# Patient Record
Sex: Male | Born: 1950 | Race: White | Hispanic: No | Smoking: Never smoker
Health system: Southern US, Community
[De-identification: ages and names within clinical notes are randomized; demographics above are authoritative.]

## PROBLEM LIST (undated history)

## (undated) DIAGNOSIS — K429 Umbilical hernia without obstruction or gangrene: Secondary | ICD-10-CM

## (undated) DIAGNOSIS — M1990 Unspecified osteoarthritis, unspecified site: Secondary | ICD-10-CM

## (undated) DIAGNOSIS — J449 Chronic obstructive pulmonary disease, unspecified: Secondary | ICD-10-CM

## (undated) DIAGNOSIS — K219 Gastro-esophageal reflux disease without esophagitis: Secondary | ICD-10-CM

## (undated) DIAGNOSIS — N209 Urinary calculus, unspecified: Secondary | ICD-10-CM

## (undated) HISTORY — DX: Chronic obstructive pulmonary disease, unspecified: J44.9

## (undated) HISTORY — PX: CHOLECYSTECTOMY: SHX55

---

## 1994-04-23 HISTORY — PX: HERNIA REPAIR: SHX51

## 2011-11-13 ENCOUNTER — Ambulatory Visit (HOSPITAL_COMMUNITY)
Admission: RE | Admit: 2011-11-13 | Discharge: 2011-11-13 | Disposition: A | Discharge status: Home / Self Care | Payer: Self-pay | Source: Ambulatory Visit | Attending: Neurosurgery | Admitting: Neurosurgery

## 2011-11-13 DIAGNOSIS — M7989 Other specified soft tissue disorders: Secondary | ICD-10-CM | POA: Insufficient documentation

## 2011-11-13 DIAGNOSIS — R609 Edema, unspecified: Secondary | ICD-10-CM

## 2011-11-13 NOTE — Progress Notes (Signed)
Bilateral lower extremity venous duplex completed.  Preliminary report is negative for DVT, SVT, or a Baker's cyst. 

## 2011-11-15 ENCOUNTER — Other Ambulatory Visit: Payer: Self-pay | Admitting: Neurosurgery

## 2011-12-10 ENCOUNTER — Encounter (HOSPITAL_COMMUNITY)
Admission: RE | Admit: 2011-12-10 | Discharge: 2011-12-10 | Payer: Self-pay | Source: Ambulatory Visit | Attending: Neurosurgery | Admitting: Neurosurgery

## 2012-01-11 ENCOUNTER — Other Ambulatory Visit: Payer: Self-pay | Admitting: Neurosurgery

## 2012-01-11 ENCOUNTER — Encounter (HOSPITAL_COMMUNITY)
Admission: RE | Admit: 2012-01-11 | Discharge: 2012-01-11 | Payer: Self-pay | Source: Ambulatory Visit | Attending: Neurosurgery | Admitting: Neurosurgery

## 2012-01-11 NOTE — Pre-Procedure Instructions (Signed)
20 LACHLAN MCKIM  01/11/2012   Your procedure is scheduled on:  Thursday September 26  Report to [facility] Short Stay Center at 5:30 AM.  Call this number if you have problems the morning of surgery: [phone]   Remember:   Do not eat or drink:After Midnight.    Take these medicines the morning of surgery with A SIP OF WATER:    Do not wear jewelry, make-up or nail polish.  Do not wear lotions, powders, or perfumes. You may wear deodorant.  Do not shave 48 hours prior to surgery. Men may shave face and neck.  Do not bring valuables to the hospital.  Contacts, dentures or bridgework may not be worn into surgery.  Leave suitcase in the car. After surgery it may be brought to your room.  For patients admitted to the hospital, checkout time is 11:00 AM the day of discharge.   Patients discharged the day of surgery will not be allowed to drive home.  Name and phone number of your driver: NA  Special Instructions: Shower using CHG 2 nights before surgery and the night before surgery.  If you shower the day of surgery use CHG.  Use special wash - you have one bottle of CHG for all showers.  You should use approximately 1/3 of the bottle for each shower.   Please read over the following fact sheets that you were given: Pain Booklet, Coughing and Deep Breathing, Blood Transfusion Information and Surgical Site Infection Prevention

## 2012-01-17 ENCOUNTER — Encounter (HOSPITAL_COMMUNITY): Admission: RE | Payer: Self-pay | Source: Ambulatory Visit

## 2012-01-17 ENCOUNTER — Ambulatory Visit (HOSPITAL_COMMUNITY): Admission: RE | Admit: 2012-01-17 | Payer: Self-pay | Source: Ambulatory Visit | Admitting: Neurosurgery

## 2012-01-17 SURGERY — POSTERIOR LUMBAR FUSION 1 LEVEL
Anesthesia: General | Site: Back | Laterality: Bilateral

## 2014-08-05 ENCOUNTER — Other Ambulatory Visit: Payer: Self-pay | Admitting: Neurosurgery

## 2014-09-14 ENCOUNTER — Encounter (HOSPITAL_COMMUNITY)
Admission: RE | Admit: 2014-09-14 | Discharge: 2014-09-14 | Disposition: A | Discharge status: Home / Self Care | Payer: Medicare Other | Source: Ambulatory Visit | Attending: Neurosurgery | Admitting: Neurosurgery

## 2014-09-14 ENCOUNTER — Encounter (HOSPITAL_COMMUNITY): Payer: Self-pay

## 2014-09-14 DIAGNOSIS — Z01812 Encounter for preprocedural laboratory examination: Secondary | ICD-10-CM | POA: Insufficient documentation

## 2014-09-14 DIAGNOSIS — M4316 Spondylolisthesis, lumbar region: Secondary | ICD-10-CM | POA: Insufficient documentation

## 2014-09-14 HISTORY — DX: Urinary calculus, unspecified: N20.9

## 2014-09-14 HISTORY — DX: Unspecified osteoarthritis, unspecified site: M19.90

## 2014-09-14 HISTORY — DX: Umbilical hernia without obstruction or gangrene: K42.9

## 2014-09-14 HISTORY — DX: Gastro-esophageal reflux disease without esophagitis: K21.9

## 2014-09-14 LAB — CBC
HCT: 44.9 % (ref 39.0–52.0)
Hemoglobin: 15.6 g/dL (ref 13.0–17.0)
MCH: 32.3 pg (ref 26.0–34.0)
MCHC: 34.7 g/dL (ref 30.0–36.0)
MCV: 93 fL (ref 78.0–100.0)
Platelets: 148 10*3/uL — ABNORMAL LOW (ref 150–400)
RBC: 4.83 MIL/uL (ref 4.22–5.81)
RDW: 13.6 % (ref 11.5–15.5)
WBC: 8.8 10*3/uL (ref 4.0–10.5)

## 2014-09-14 LAB — BASIC METABOLIC PANEL
Anion gap: 9 (ref 5–15)
BUN: 14 mg/dL (ref 6–20)
CALCIUM: 9.6 mg/dL (ref 8.9–10.3)
CHLORIDE: 99 mmol/L — AB (ref 101–111)
CO2: 28 mmol/L (ref 22–32)
Creatinine, Ser: 0.66 mg/dL (ref 0.61–1.24)
GFR calc Af Amer: >60 mL/min (ref >60)
GLUCOSE: 100 mg/dL — AB (ref 65–99)
Potassium: 4.3 mmol/L (ref 3.5–5.1)
Sodium: 136 mmol/L (ref 135–145)

## 2014-09-14 LAB — ABO/RH: ABO/RH(D): O POS

## 2014-09-14 LAB — TYPE AND SCREEN
ABO/RH(D): O POS
ANTIBODY SCREEN: NEGATIVE

## 2014-09-14 LAB — SURGICAL PCR SCREEN
MRSA, PCR: NEGATIVE
STAPHYLOCOCCUS AUREUS: NEGATIVE

## 2014-09-14 NOTE — Progress Notes (Signed)
Office called to sign orders

## 2014-09-14 NOTE — Pre-Procedure Instructions (Addendum)
William Dunlap  09/14/2014     No Pharmacies Listed   Your procedure is scheduled on 09/22/14  [facility]  [facility] short stay admitting at  745 A.M.  Call this number if you have problems the morning of surgery:  [phone]   Remember:  Do not eat food or drink liquids after midnight.  Take these medicines the morning of surgery with A SIP OF WATER methadone, prilosec    STOP all herbel meds, nsaids (aleve,naproxen,advil,ibuprofen) 5 days prior to surgery starting 5.27.16 including vitamins, aspirin,coq10   Do not wear jewelry, make-up or nail polish.  Do not wear lotions, powders, or perfumes.  You may wear deodorant.  Do not shave 48 hours prior to surgery.  Men may shave face and neck.  Do not bring valuables to the hospital.  [facility] is not responsible for any belongings or valuables.  Contacts, dentures or bridgework may not be worn into surgery.  Leave your suitcase in the car.  After surgery it may be brought to your room.  For patients admitted to the hospital, discharge time will be determined by your treatment team.  Patients discharged the day of surgery will not be allowed to drive home.   Name and phone number of your driver:    Special instructions:   Special Instructions: [facility] - Preparing for Surgery  Before surgery, you can play an important role.  Because skin is not sterile, your skin needs to be as free of germs as possible.  You can reduce the number of germs on you skin by washing with CHG (chlorahexidine gluconate) soap before surgery.  CHG is an antiseptic cleaner which kills germs and bonds with the skin to continue killing germs even after washing.  Please DO NOT use if you have an allergy to CHG or antibacterial soaps.  If your skin becomes reddened/irritated stop using the CHG and inform your nurse when you arrive at Short Stay.  Do not shave (including legs and underarms) for at least 48 hours prior to the first CHG shower.  You may shave  your face.  Please follow these instructions carefully:   1.  Shower with CHG Soap the night before surgery and the morning of Surgery.  2.  If you choose to wash your hair, wash your hair first as usual with your normal shampoo.  3.  After you shampoo, rinse your hair and body thoroughly to remove the Shampoo.  4.  Use CHG as you would any other liquid soap.  You can apply chg directly  to the skin and wash gently with scrungie or a clean washcloth.  5.  Apply the CHG Soap to your body ONLY FROM THE NECK DOWN.  Do not use on open wounds or open sores.  Avoid contact with your eyes ears, mouth and genitals (private parts).  Wash genitals (private parts)       with your normal soap.  6.  Wash thoroughly, paying special attention to the area where your surgery will be performed.  7.  Thoroughly rinse your body with warm water from the neck down.  8.  DO NOT shower/wash with your normal soap after using and rinsing off the CHG Soap.  9.  Pat yourself dry with a clean towel.            10.  Wear clean pajamas.            11.  Place clean sheets on your bed  the night of your first shower and do not sleep with pets.  Day of Surgery  Do not apply any lotions/deodorants the morning of surgery.  Please wear clean clothes to the hospital/surgery center.  Please read over the following fact sheets that you were given. Pain Booklet, Coughing and Deep Breathing, Blood Transfusion Information, MRSA Information and Surgical Site Infection Prevention

## 2014-09-21 MED ORDER — DEXAMETHASONE SODIUM PHOSPHATE 10 MG/ML IJ SOLN
10.0000 mg | INTRAMUSCULAR | Status: DC
  Filled 2014-09-21: qty 1

## 2014-09-21 MED ORDER — CEFAZOLIN SODIUM-DEXTROSE 2-3 GM-% IV SOLR
2.0000 g | INTRAVENOUS | Status: AC
  Administered 2014-09-22 (×2): 2 g via INTRAVENOUS
  Filled 2014-09-21: qty 50

## 2014-09-22 ENCOUNTER — Inpatient Hospital Stay (HOSPITAL_COMMUNITY): Payer: Medicare HMO | Admitting: Certified Registered Nurse Anesthetist

## 2014-09-22 ENCOUNTER — Encounter (HOSPITAL_COMMUNITY): Payer: Self-pay | Admitting: *Deleted

## 2014-09-22 ENCOUNTER — Encounter (HOSPITAL_COMMUNITY)
Admission: RE | Disposition: A | Discharge status: Home / Self Care | Payer: Self-pay | Source: Ambulatory Visit | Attending: Neurosurgery

## 2014-09-22 ENCOUNTER — Inpatient Hospital Stay (HOSPITAL_COMMUNITY): Payer: Medicare HMO

## 2014-09-22 ENCOUNTER — Inpatient Hospital Stay (HOSPITAL_COMMUNITY)
Admission: RE | Admit: 2014-09-22 | Discharge: 2014-09-25 | DRG: 460 | Disposition: A | Discharge status: Home / Self Care | Payer: Medicare HMO | Source: Ambulatory Visit | Attending: Neurosurgery | Admitting: Neurosurgery

## 2014-09-22 DIAGNOSIS — Z882 Allergy status to sulfonamides status: Secondary | ICD-10-CM | POA: Diagnosis not present

## 2014-09-22 DIAGNOSIS — Z791 Long term (current) use of non-steroidal anti-inflammatories (NSAID): Secondary | ICD-10-CM | POA: Diagnosis not present

## 2014-09-22 DIAGNOSIS — M1990 Unspecified osteoarthritis, unspecified site: Secondary | ICD-10-CM | POA: Diagnosis present

## 2014-09-22 DIAGNOSIS — M4806 Spinal stenosis, lumbar region: Secondary | ICD-10-CM | POA: Diagnosis present

## 2014-09-22 DIAGNOSIS — K219 Gastro-esophageal reflux disease without esophagitis: Secondary | ICD-10-CM | POA: Diagnosis present

## 2014-09-22 DIAGNOSIS — M4317 Spondylolisthesis, lumbosacral region: Secondary | ICD-10-CM | POA: Diagnosis present

## 2014-09-22 DIAGNOSIS — Z79899 Other long term (current) drug therapy: Secondary | ICD-10-CM | POA: Diagnosis not present

## 2014-09-22 DIAGNOSIS — M79604 Pain in right leg: Secondary | ICD-10-CM | POA: Diagnosis present

## 2014-09-22 DIAGNOSIS — Z419 Encounter for procedure for purposes other than remedying health state, unspecified: Secondary | ICD-10-CM

## 2014-09-22 LAB — GLUCOSE, CAPILLARY: GLUCOSE-CAPILLARY: 163 mg/dL — AB (ref 65–99)

## 2014-09-22 SURGERY — POSTERIOR LUMBAR FUSION 3 LEVEL
Anesthesia: General | Site: Back

## 2014-09-22 MED ORDER — PROPOFOL 10 MG/ML IV BOLUS
INTRAVENOUS | Status: DC | PRN
  Administered 2014-09-22: 20 mg via INTRAVENOUS
  Administered 2014-09-22: 120 mg via INTRAVENOUS
  Administered 2014-09-22: 30 mg via INTRAVENOUS

## 2014-09-22 MED ORDER — OXYCODONE-ACETAMINOPHEN 5-325 MG PO TABS
1.0000 | ORAL_TABLET | ORAL | Status: DC | PRN
  Administered 2014-09-22 – 2014-09-25 (×11): 2 via ORAL
  Filled 2014-09-22 (×10): qty 2

## 2014-09-22 MED ORDER — DEXAMETHASONE SODIUM PHOSPHATE 4 MG/ML IJ SOLN
INTRAMUSCULAR | Status: DC | PRN
  Administered 2014-09-22: 4 mg via INTRAVENOUS

## 2014-09-22 MED ORDER — SENNOSIDES-DOCUSATE SODIUM 8.6-50 MG PO TABS
1.0000 | ORAL_TABLET | Freq: Every evening | ORAL | Status: DC | PRN
  Administered 2014-09-23 – 2014-09-24 (×2): 1 via ORAL
  Filled 2014-09-22 (×2): qty 1

## 2014-09-22 MED ORDER — CYCLOBENZAPRINE HCL 10 MG PO TABS
ORAL_TABLET | ORAL | Status: AC
  Filled 2014-09-22: qty 1

## 2014-09-22 MED ORDER — BUPIVACAINE HCL (PF) 0.25 % IJ SOLN: INTRAMUSCULAR | Status: DC | PRN

## 2014-09-22 MED ORDER — GLYCOPYRROLATE 0.2 MG/ML IJ SOLN
INTRAMUSCULAR | Status: DC | PRN
  Administered 2014-09-22: .8 mg via INTRAVENOUS
  Administered 2014-09-22: .2 mg via INTRAVENOUS

## 2014-09-22 MED ORDER — ARTIFICIAL TEARS OP OINT
TOPICAL_OINTMENT | OPHTHALMIC | Status: AC
  Filled 2014-09-22: qty 3.5

## 2014-09-22 MED ORDER — OXYCODONE-ACETAMINOPHEN 5-325 MG PO TABS
ORAL_TABLET | ORAL | Status: AC
  Filled 2014-09-22: qty 2

## 2014-09-22 MED ORDER — FENTANYL CITRATE (PF) 250 MCG/5ML IJ SOLN
INTRAMUSCULAR | Status: AC
  Filled 2014-09-22: qty 5

## 2014-09-22 MED ORDER — GLYCOPYRROLATE 0.2 MG/ML IJ SOLN
INTRAMUSCULAR | Status: AC
  Filled 2014-09-22: qty 2

## 2014-09-22 MED ORDER — LACTATED RINGERS IV SOLN
INTRAVENOUS | Status: DC
Start: 2014-09-22 — End: 2014-09-25
  Administered 2014-09-22 (×4): via INTRAVENOUS

## 2014-09-22 MED ORDER — IBUPROFEN 200 MG PO TABS: 400.0000 mg | ORAL_TABLET | Freq: Three times a day (TID) | ORAL | Status: DC | PRN

## 2014-09-22 MED ORDER — MIDAZOLAM HCL 2 MG/2ML IJ SOLN
INTRAMUSCULAR | Status: AC
  Filled 2014-09-22: qty 2

## 2014-09-22 MED ORDER — NEOSTIGMINE METHYLSULFATE 10 MG/10ML IV SOLN
INTRAVENOUS | Status: DC | PRN
  Administered 2014-09-22: 5 mg via INTRAVENOUS

## 2014-09-22 MED ORDER — ROCURONIUM BROMIDE 50 MG/5ML IV SOLN
INTRAVENOUS | Status: AC
  Filled 2014-09-22: qty 1

## 2014-09-22 MED ORDER — POTASSIUM CHLORIDE IN NACL 20-0.9 MEQ/L-% IV SOLN
INTRAVENOUS | Status: DC
Start: 2014-09-22 — End: 2014-09-25
  Administered 2014-09-22: 21:00:00 via INTRAVENOUS
  Filled 2014-09-22: qty 1000

## 2014-09-22 MED ORDER — DEXTROSE 5 % IV SOLN
10.0000 mg | INTRAVENOUS | Status: DC | PRN
  Administered 2014-09-22: 10 ug/min via INTRAVENOUS

## 2014-09-22 MED ORDER — SODIUM CHLORIDE 0.9 % IV SOLN: 250.0000 mL | INTRAVENOUS | Status: DC

## 2014-09-22 MED ORDER — HYDROMORPHONE HCL 1 MG/ML IJ SOLN
INTRAMUSCULAR | Status: AC
  Filled 2014-09-22: qty 1

## 2014-09-22 MED ORDER — ACETAMINOPHEN 325 MG PO TABS
650.0000 mg | ORAL_TABLET | ORAL | Status: DC | PRN
  Filled 2014-09-22: qty 2

## 2014-09-22 MED ORDER — MEPERIDINE HCL 25 MG/ML IJ SOLN: 6.2500 mg | INTRAMUSCULAR | Status: DC | PRN

## 2014-09-22 MED ORDER — VANCOMYCIN HCL 1000 MG IV SOLR
INTRAVENOUS | Status: DC | PRN
  Administered 2014-09-22: 1000 mg via TOPICAL

## 2014-09-22 MED ORDER — BUPIVACAINE LIPOSOME 1.3 % IJ SUSP
INTRAMUSCULAR | Status: DC | PRN
  Administered 2014-09-22: 20 mL

## 2014-09-22 MED ORDER — SODIUM CHLORIDE 0.9 % IV SOLN
INTRAVENOUS | Status: DC | PRN
  Administered 2014-09-22: 15:00:00 via INTRAVENOUS

## 2014-09-22 MED ORDER — PHENOL 1.4 % MT LIQD: 1.0000 | OROMUCOSAL | Status: DC | PRN

## 2014-09-22 MED ORDER — PROPOFOL 10 MG/ML IV BOLUS
INTRAVENOUS | Status: AC
  Filled 2014-09-22: qty 20

## 2014-09-22 MED ORDER — EPHEDRINE SULFATE 50 MG/ML IJ SOLN
INTRAMUSCULAR | Status: DC | PRN
  Administered 2014-09-22: 10 mg via INTRAVENOUS
  Administered 2014-09-22 (×2): 5 mg via INTRAVENOUS

## 2014-09-22 MED ORDER — MENTHOL 3 MG MT LOZG: 1.0000 | LOZENGE | OROMUCOSAL | Status: DC | PRN

## 2014-09-22 MED ORDER — GLYCOPYRROLATE 0.2 MG/ML IJ SOLN
INTRAMUSCULAR | Status: AC
  Filled 2014-09-22: qty 1

## 2014-09-22 MED ORDER — HYDROMORPHONE HCL 1 MG/ML IJ SOLN
0.2500 mg | INTRAMUSCULAR | Status: DC | PRN
  Administered 2014-09-22 (×4): 0.5 mg via INTRAVENOUS

## 2014-09-22 MED ORDER — METHADONE HCL 10 MG PO TABS
40.0000 mg | ORAL_TABLET | Freq: Every day | ORAL | Status: DC
  Administered 2014-09-23 – 2014-09-25 (×3): 40 mg via ORAL
  Filled 2014-09-22 (×3): qty 4

## 2014-09-22 MED ORDER — CYCLOBENZAPRINE HCL 10 MG PO TABS
10.0000 mg | ORAL_TABLET | Freq: Three times a day (TID) | ORAL | Status: DC | PRN
  Administered 2014-09-22 – 2014-09-24 (×5): 10 mg via ORAL
  Filled 2014-09-22 (×4): qty 1

## 2014-09-22 MED ORDER — FENTANYL CITRATE (PF) 100 MCG/2ML IJ SOLN
INTRAMUSCULAR | Status: DC | PRN
  Administered 2014-09-22 (×15): 50 ug via INTRAVENOUS

## 2014-09-22 MED ORDER — ONDANSETRON HCL 4 MG/2ML IJ SOLN
4.0000 mg | INTRAMUSCULAR | Status: DC | PRN
Start: 2014-09-22 — End: 2014-09-25

## 2014-09-22 MED ORDER — PROMETHAZINE HCL 25 MG/ML IJ SOLN: 6.2500 mg | INTRAMUSCULAR | Status: DC | PRN

## 2014-09-22 MED ORDER — HYDROMORPHONE HCL 1 MG/ML IJ SOLN
0.5000 mg | INTRAMUSCULAR | Status: DC | PRN
  Administered 2014-09-22: 1 mg via INTRAVENOUS
  Administered 2014-09-22 (×2): 0.5 mg via INTRAVENOUS
  Administered 2014-09-23 – 2014-09-25 (×12): 1 mg via INTRAVENOUS
  Filled 2014-09-22 (×13): qty 1

## 2014-09-22 MED ORDER — ROCURONIUM BROMIDE 100 MG/10ML IV SOLN
INTRAVENOUS | Status: DC | PRN
  Administered 2014-09-22 (×3): 10 mg via INTRAVENOUS
  Administered 2014-09-22 (×3): 5 mg via INTRAVENOUS
  Administered 2014-09-22: 10 mg via INTRAVENOUS
  Administered 2014-09-22: 40 mg via INTRAVENOUS
  Administered 2014-09-22: 10 mg via INTRAVENOUS
  Administered 2014-09-22: 20 mg via INTRAVENOUS

## 2014-09-22 MED ORDER — SODIUM CHLORIDE 0.9 % IJ SOLN
3.0000 mL | Freq: Two times a day (BID) | INTRAMUSCULAR | Status: DC
  Administered 2014-09-23 – 2014-09-24 (×3): 3 mL via INTRAVENOUS

## 2014-09-22 MED ORDER — PANTOPRAZOLE SODIUM 40 MG PO TBEC
40.0000 mg | DELAYED_RELEASE_TABLET | Freq: Every day | ORAL | Status: DC
  Administered 2014-09-23 – 2014-09-25 (×3): 40 mg via ORAL
  Filled 2014-09-22 (×3): qty 1

## 2014-09-22 MED ORDER — ACETAMINOPHEN 650 MG RE SUPP: 650.0000 mg | RECTAL | Status: DC | PRN

## 2014-09-22 MED ORDER — CEFAZOLIN SODIUM 1-5 GM-% IV SOLN
1.0000 g | Freq: Three times a day (TID) | INTRAVENOUS | Status: AC
Start: 2014-09-22 — End: 2014-09-23
  Administered 2014-09-22 – 2014-09-23 (×2): 1 g via INTRAVENOUS
  Filled 2014-09-22 (×2): qty 50

## 2014-09-22 MED ORDER — ONDANSETRON HCL 4 MG/2ML IJ SOLN
INTRAMUSCULAR | Status: AC
  Filled 2014-09-22: qty 2

## 2014-09-22 MED ORDER — MIDAZOLAM HCL 5 MG/5ML IJ SOLN
INTRAMUSCULAR | Status: DC | PRN
  Administered 2014-09-22: 2 mg via INTRAVENOUS

## 2014-09-22 MED ORDER — BACITRACIN 50000 UNITS IM SOLR
INTRAMUSCULAR | Status: DC | PRN
  Administered 2014-09-22 (×2)

## 2014-09-22 MED ORDER — HYDROMORPHONE HCL 1 MG/ML IJ SOLN
INTRAMUSCULAR | Status: DC | PRN
  Administered 2014-09-22 (×4): .5 mg via INTRAVENOUS

## 2014-09-22 MED ORDER — THROMBIN 20000 UNITS EX SOLR
CUTANEOUS | Status: DC | PRN
  Administered 2014-09-22 (×3): via TOPICAL

## 2014-09-22 MED ORDER — BUPIVACAINE LIPOSOME 1.3 % IJ SUSP
20.0000 mL | Freq: Once | INTRAMUSCULAR | Status: DC
  Filled 2014-09-22: qty 20

## 2014-09-22 MED ORDER — SODIUM CHLORIDE 0.9 % IJ SOLN: 3.0000 mL | INTRAMUSCULAR | Status: DC | PRN

## 2014-09-22 MED ORDER — VANCOMYCIN HCL 1000 MG IV SOLR
INTRAVENOUS | Status: AC
  Filled 2014-09-22: qty 1000

## 2014-09-22 MED ORDER — LIDOCAINE-EPINEPHRINE 1 %-1:100000 IJ SOLN
INTRAMUSCULAR | Status: DC | PRN
  Administered 2014-09-22: 10 mL

## 2014-09-22 MED ORDER — 0.9 % SODIUM CHLORIDE (POUR BTL) OPTIME
TOPICAL | Status: DC | PRN
  Administered 2014-09-22 (×2): 1000 mL

## 2014-09-22 MED ORDER — ONDANSETRON HCL 4 MG/2ML IJ SOLN
INTRAMUSCULAR | Status: DC | PRN
  Administered 2014-09-22 (×2): 4 mg via INTRAVENOUS

## 2014-09-22 SURGICAL SUPPLY — 81 items
ALLOSTEM STRIP 20MMX50MM (Tissue) ×2 IMPLANT
BAG DECANTER FOR FLEXI CONT (MISCELLANEOUS) ×4 IMPLANT
BENZOIN TINCTURE PRP APPL 2/3 (GAUZE/BANDAGES/DRESSINGS) ×2 IMPLANT
BLADE CLIPPER SURG (BLADE) ×2 IMPLANT
BLADE SURG 11 STRL SS (BLADE) ×2 IMPLANT
BONE ALLOSTEM MORSELIZED 5CC (Bone Implant) ×2 IMPLANT
BRUSH SCRUB EZ PLAIN DRY (MISCELLANEOUS) ×2 IMPLANT
BUR MATCHSTICK NEURO 3.0 LAGG (BURR) ×2 IMPLANT
BUR PRECISION FLUTE 6.0 (BURR) ×2 IMPLANT
CAGE RISE 11-17-15 10X22 (Cage) ×4 IMPLANT
CANISTER SUCT 3000ML PPV (MISCELLANEOUS) ×2 IMPLANT
CAP LOCKING THREADED (Cap) ×16 IMPLANT
CONT SPEC 4OZ CLIKSEAL STRL BL (MISCELLANEOUS) ×4 IMPLANT
COVER BACK TABLE 60X90IN (DRAPES) ×2 IMPLANT
CROSSLINK SPINAL FUSION (Cage) ×2 IMPLANT
DECANTER SPIKE VIAL GLASS SM (MISCELLANEOUS) ×2 IMPLANT
DERMABOND ADHESIVE PROPEN (GAUZE/BANDAGES/DRESSINGS) ×1
DERMABOND ADVANCED .7 DNX6 (GAUZE/BANDAGES/DRESSINGS) ×1 IMPLANT
DRAPE C-ARM 42X72 X-RAY (DRAPES) ×2 IMPLANT
DRAPE C-ARMOR (DRAPES) ×2 IMPLANT
DRAPE LAPAROTOMY 100X72X124 (DRAPES) ×2 IMPLANT
DRAPE POUCH INSTRU U-SHP 10X18 (DRAPES) ×2 IMPLANT
DRAPE PROXIMA HALF (DRAPES) IMPLANT
DRAPE SURG 17X23 STRL (DRAPES) ×2 IMPLANT
DRSG OPSITE 4X5.5 SM (GAUZE/BANDAGES/DRESSINGS) ×2 IMPLANT
DRSG OPSITE POSTOP 4X6 (GAUZE/BANDAGES/DRESSINGS) IMPLANT
DRSG OPSITE POSTOP 4X8 (GAUZE/BANDAGES/DRESSINGS) ×2 IMPLANT
DURAPREP 26ML APPLICATOR (WOUND CARE) ×2 IMPLANT
ELECT REM PT RETURN 9FT ADLT (ELECTROSURGICAL) ×2
ELECTRODE REM PT RTRN 9FT ADLT (ELECTROSURGICAL) ×1 IMPLANT
EVACUATOR 3/16  PVC DRAIN (DRAIN) ×1
EVACUATOR 3/16 PVC DRAIN (DRAIN) ×1 IMPLANT
GAUZE SPONGE 4X4 12PLY STRL (GAUZE/BANDAGES/DRESSINGS) ×2 IMPLANT
GAUZE SPONGE 4X4 16PLY XRAY LF (GAUZE/BANDAGES/DRESSINGS) ×6 IMPLANT
GLOVE BIO SURGEON STRL SZ8 (GLOVE) ×4 IMPLANT
GLOVE BIOGEL PI IND STRL 8 (GLOVE) ×5 IMPLANT
GLOVE BIOGEL PI INDICATOR 8 (GLOVE) ×5
GLOVE ECLIPSE 7.5 STRL STRAW (GLOVE) ×12 IMPLANT
GLOVE EXAM NITRILE LRG STRL (GLOVE) IMPLANT
GLOVE EXAM NITRILE MD LF STRL (GLOVE) IMPLANT
GLOVE EXAM NITRILE XL STR (GLOVE) IMPLANT
GLOVE EXAM NITRILE XS STR PU (GLOVE) IMPLANT
GLOVE INDICATOR 8.5 STRL (GLOVE) ×4 IMPLANT
GLOVE SURG SS PI 7.0 STRL IVOR (GLOVE) ×12 IMPLANT
GOWN STRL REUS W/ TWL LRG LVL3 (GOWN DISPOSABLE) ×2 IMPLANT
GOWN STRL REUS W/ TWL XL LVL3 (GOWN DISPOSABLE) ×3 IMPLANT
GOWN STRL REUS W/TWL 2XL LVL3 (GOWN DISPOSABLE) ×8 IMPLANT
GOWN STRL REUS W/TWL LRG LVL3 (GOWN DISPOSABLE) ×2
GOWN STRL REUS W/TWL XL LVL3 (GOWN DISPOSABLE) ×3
KIT BASIN OR (CUSTOM PROCEDURE TRAY) ×2 IMPLANT
KIT ROOM TURNOVER OR (KITS) ×2 IMPLANT
LIQUID BAND (GAUZE/BANDAGES/DRESSINGS) IMPLANT
NEEDLE HYPO 21X1.5 SAFETY (NEEDLE) ×2 IMPLANT
NEEDLE HYPO 25X1 1.5 SAFETY (NEEDLE) ×2 IMPLANT
NS IRRIG 1000ML POUR BTL (IV SOLUTION) ×4 IMPLANT
PACK LAMINECTOMY NEURO (CUSTOM PROCEDURE TRAY) ×2 IMPLANT
PAD ARMBOARD 7.5X6 YLW CONV (MISCELLANEOUS) ×6 IMPLANT
PATTIES SURGICAL 1X1 (DISPOSABLE) ×4 IMPLANT
ROD 85MM SPINAL (Rod) ×2 IMPLANT
ROD TI CVD 5.5MMX90MM (Rod) ×2 IMPLANT
SCREW AMP MODULAR CREO 6.5X45 (Screw) ×8 IMPLANT
SCREW CREO 5.5X40 (Screw) ×2 IMPLANT
SCREW CREO 7.5X40MM (Screw) ×4 IMPLANT
SCREW CREO MOD AMP 5.5X45MM (Screw) ×2 IMPLANT
SCREW PA THRD CREO TULIP 5.5X4 (Head) ×16 IMPLANT
SLEEVE SURGEON STRL (DRAPES) ×2 IMPLANT
SPACER RISE 10X22 8-14MM-10 (Spacer) ×4 IMPLANT
SPACER RISE 10X26 10-17MM (Spacer) ×4 IMPLANT
SPONGE LAP 4X18 X RAY DECT (DISPOSABLE) ×2 IMPLANT
SPONGE SURGIFOAM ABS GEL 100 (HEMOSTASIS) ×4 IMPLANT
STRIP CLOSURE SKIN 1/2X4 (GAUZE/BANDAGES/DRESSINGS) ×2 IMPLANT
SUT VIC AB 0 CT1 18XCR BRD8 (SUTURE) ×2 IMPLANT
SUT VIC AB 0 CT1 8-18 (SUTURE) ×2
SUT VIC AB 2-0 CT1 18 (SUTURE) ×4 IMPLANT
SUT VICRYL 4-0 PS2 18IN ABS (SUTURE) ×4 IMPLANT
SYR 20CC LL (SYRINGE) ×2 IMPLANT
SYR 20ML ECCENTRIC (SYRINGE) ×2 IMPLANT
TOWEL OR 17X24 6PK STRL BLUE (TOWEL DISPOSABLE) ×2 IMPLANT
TOWEL OR 17X26 10 PK STRL BLUE (TOWEL DISPOSABLE) ×2 IMPLANT
TRAY FOLEY W/METER SILVER 14FR (SET/KITS/TRAYS/PACK) IMPLANT
WATER STERILE IRR 1000ML POUR (IV SOLUTION) ×2 IMPLANT

## 2014-09-22 NOTE — Transfer of Care (Signed)
Immediate Anesthesia Transfer of Care Note  Patient: William Dunlap  Procedure(s) Performed: Procedure(s): Posterior Lumbar interbody fusion lumbar three-four, Lumbar four- five, Lumbar five-Sacral one (N/A)  Patient Location: PACU  Anesthesia Type:General  Level of Consciousness: awake, alert  and oriented  Airway & Oxygen Therapy: Patient Spontanous Breathing  Post-op Assessment: Report given to RN and Post -op Vital signs reviewed and stable  Post vital signs: Reviewed and stable  Last Vitals:  Filed Vitals:   09/22/14 0924  BP: 137/74  Pulse: 67  Temp: 36.8 C  Resp: 20    Complications: No apparent anesthesia complications

## 2014-09-22 NOTE — Op Note (Signed)
Preoperative diagnosis: Grade 1 spondylolisthesis L5-S1 severe lumbar spinal stenosis and herniated pulposus L3-4 and L4-5 with bilateral L3-L4 and L5 5 radiculopathies  Postoperative diagnosis: Same  Procedure: Gill decompression L5-S1 with radical foraminotomies complete facetectomies L5-S1  2 decompressive lumbar laminectomy is L3-4 and L4-5 and excess and requiring more work than would be needed with a standard interbody fusion with complete medial facetectomies radical foraminotomies of the L3-L4 and L5 nerve roots.  #3 posterior lumbar interbody fusion using the globus rise expandable cage system utilizing locally harvested autograft mixed with allostem morsels and strips  #4 pedicle screw fixation using the Creo modular pedicle screw system from L3-S1  #5 posterior lateral arthrodesis L3-S1 using locally harvested autograft mixed with alloostem morsels and strips  #6 placement of a large Hemovac drain  Surgeon: Jillyn Hidden Quintrell Baze  Asst.: Shirlean Kelly  Anesthesia: Gen.  EBL: 1500  Cell Saver: 825 given back  History of present illness: Patient is a 64 year old gentleman with back and right greater left leg pain with imaging revealed severe lumbar spinal stenosis and herniated nucleus the process L3-4 L4-5 and a grade 1 spinal listhesis with severe foraminal stenosis L5-S1. Due to patient's failed conservative treatment imaging findings and progression of clinical syndrome I recommended decompression stabilization procedure procedure from L3-S1 I extensively reviewed the risks and benefits of the operation with the patient as well as perioperative course expectations of outcome and alternatives of surgery and he understands and agrees to proceed forward.  Operative procedure: Patient brought in the or was induced under general anesthesia positioned prone the Wilson frame his back was prepped and draped in routine sterile fashion after infiltration of 10 mL lidocaine with epi a midline  incision was made and Bovie light cautery was used to take down the subcutaneous tissues and subperiosteal dissections care lamina of L3, L4, L5, and S1. Interoperative x-ray confirmed identification of the appropriate level so after the TPs of been exposed from L3-S1 central decompression was begun with removal of the L3, L4, and L5 spinous processes. Central decompression was begun with a central laminectomy was marked lateral recess stenosis and severe Eakle sac compression from hourglass compression of thecal sac predominately at L4-5 and L5-S1 with telescoping of the lamina and the level above. Complete facetectomies were performed at L3-4, L4-5, and L5-S1. Radical foraminotomies were also performed of the L3, L4 and L5 nerve roots. Gill decompression was performed at L5-S1 with drilling of the inferomedial aspect of the pedicle and under biting the pedicle with a 2 Kerrison to decompress the L5 nerve root out the foramen. Aggressive undercutting the superior tickling facets was carried out at all levels to gain access lateral margins disc space. At this point is to pedicle screw placement using a high-speed drill and fluoroscopy pilot holes were drilled at L3 cannulated with the awl probed With a 55 tap and 6 5 x 45 screws were inserted L3. This procedure was repeated in similar fashion at L4 and L5 and S1 with a 5 5 x 45 set L5 and 7 5 x 40 at S1. All screws placed bilaterally fluoroscopy confirmed good position of all the implants. After all screws placed distally the interbody work to space was cleaned out bilaterally first working at L5-S1 using sequential distraction I reduced the deformity reduced listhesis L5-S1 cleaned out the disc space bilaterally. The endplates bilaterally placed 8-14 expandable 10 of cages at L5-S1 and a similar fashion L3-4 and L4-5 were cleaned out of the herniated disc on the  right was identified medially at L3-4 on this was teased off of the 4 root and also cleaned out  aggressive central disc at L4-5. Pin the 17 minimal cages were inserted L4-5 and 11-17 were inserted L3-4. All the cages were in good position local autograft mixed with the allsotem mix of been packed centrally between the cages and after all the implants had been placed posterior fluoroscopy confirmed good position of all the implants I did have a question that the left L5 screw had displacement laterally symmetric sclerotic probed again and was bone all around it I replaced the screw and fluoroscopy confirmed good position. Then I assembled our rods lordosis to him tightened at the top tightening nuts reexplored the foramen after I packed and aggressive amount of posterior lateral bone from L3-S1. All along the TPs lateral gutters and facet complexes. I did that after aggressive decortication. Then I placed a cross-link all the foraminal regions were reevaluated and friendly patent with no migration of graft material Gelfoam was laid up the dura meticulous hemostasis was maintained large Hemovac drain was placed and the wounds closed in layers with interrupted Vicryls skin was closed closed with a running 4 subcuticular Dermabond benzo and Steri-Strips and sterile dressing applied patient recovered in stable condition. At the end of case on it counts sponge counts were correct.

## 2014-09-22 NOTE — Anesthesia Postprocedure Evaluation (Signed)
  Anesthesia Post-op Note  Patient: William Dunlap  Procedure(s) Performed: Procedure(s): Posterior Lumbar interbody fusion lumbar three-four, Lumbar four- five, Lumbar five-Sacral one (N/A)  Patient Location: PACU  Anesthesia Type: General   Level of Consciousness: awake, alert  and oriented  Airway and Oxygen Therapy: Patient Spontanous Breathing  Post-op Pain: moderate  Post-op Assessment: Post-op Vital signs reviewed  Post-op Vital Signs: Reviewed  Last Vitals:  Filed Vitals:   09/22/14 1745  BP: 158/95  Pulse: 72  Temp:   Resp: 11    Complications: No apparent anesthesia complications

## 2014-09-22 NOTE — Anesthesia Procedure Notes (Signed)
Procedure Name: Intubation Date/Time: 09/22/2014 11:17 AM Performed by: Daiva EvesAVENEL, Tashawn Greff W Pre-anesthesia Checklist: Patient identified, Emergency Drugs available, Suction available, Patient being monitored and Timeout performed Patient Re-evaluated:Patient Re-evaluated prior to inductionOxygen Delivery Method: Circle system utilized Preoxygenation: Pre-oxygenation with 100% oxygen Intubation Type: IV induction Ventilation: Mask ventilation without difficulty Laryngoscope Size: Miller and 2 Grade View: Grade I Tube type: Oral Tube size: 7.0 mm Number of attempts: 1 Airway Equipment and Method: Stylet Placement Confirmation: ETT inserted through vocal cords under direct vision,  positive ETCO2 and breath sounds checked- equal and bilateral Secured at: 22 cm Tube secured with: Tape Dental Injury: Teeth and Oropharynx as per pre-operative assessment  Comments: IV induction-- Germeroth- intubation AM CRNA- atraumatic- no teeth as preop- bilat BS Germeroth

## 2014-09-22 NOTE — H&P (Signed)
William Dunlap is an 64 y.o. male.   Chief Complaint: Back and right leg pain HPI: Patient is very pleasant 64 year old gentleman is a progress worsening back and right leg pain and neurogenic claudication of her very short distances. Workup is revealed severe lumbar spinal stenosis and spinal listhesis at L5 S1 and L4-5 and L3-4. Imaging revealed severe spinal stenosis at those levels into the patient takes her treatment imaging findings and progression of clinical syndrome I recommended decompression stabilization procedure from L3-S1. I extensively reviewed the risks and benefits of the operation with the patient as well as perioperative course expectations of outcome and alternatives of surgery and he understands and agrees to proceed forward.  Past Medical History  Diagnosis Date  . Stones in the urinary tract   . GERD (gastroesophageal reflux disease)   . Umbilical hernia   . Arthritis     Past Surgical History  Procedure Laterality Date  . Cholecystectomy    . Hernia repair  96    umbilical    History reviewed. No pertinent family history. Social History:  reports that he has never smoked. He does not have any smokeless tobacco history on file. He reports that he does not drink alcohol or use illicit drugs.  Allergies:  Allergies  Allergen Reactions  . Sulfur Itching and Rash    Medications Prior to Admission  Medication Sig Dispense Refill  . Coenzyme Q10 (CO Q 10 PO) Take 1 tablet by mouth daily.    Marland Kitchen. ibuprofen (ADVIL,MOTRIN) 200 MG tablet Take 400 mg by mouth every 8 (eight) hours as needed for mild pain or moderate pain.    . methadone (DOLOPHINE) 10 MG tablet Take 40-50 mg by mouth daily.    . Multiple Vitamins-Minerals (MULTIVITAMIN WITH MINERALS) tablet Take 1 tablet by mouth daily.    Marland Kitchen. omeprazole (PRILOSEC) 20 MG capsule Take 20 mg by mouth daily.      No results found for this or any previous visit (from the past 48 hour(s)). No results found.  Review of  Systems  Constitutional: Negative.   HENT: Negative.   Eyes: Negative.   Respiratory: Negative.   Cardiovascular: Negative.   Gastrointestinal: Negative.   Genitourinary: Negative.   Musculoskeletal: Positive for myalgias and back pain.  Skin: Negative.   Neurological: Negative.   Endo/Heme/Allergies: Negative.   Psychiatric/Behavioral: Negative.     Blood pressure 137/74, pulse 67, temperature 98.2 F (36.8 C), temperature source Oral, resp. rate 20, height 5\' 10"  (1.778 m), weight 117.028 kg (258 lb), SpO2 96 %. Physical Exam  Constitutional: He is oriented to person, place, and time. He appears well-developed and well-nourished.  HENT:  Head: Normocephalic.  Eyes: Pupils are equal, round, and reactive to light.  Neck: Normal range of motion.  Cardiovascular: Normal rate.   Respiratory: Effort normal.  GI: Soft.  Neurological: He is alert and oriented to person, place, and time. He has normal strength. GCS eye subscore is 4. GCS verbal subscore is 5. GCS motor subscore is 6.  Strength is 5 out of 5 in his iliopsoas, quads, hand shoes, gastric, into tibialis, and EHL.  Skin: Skin is warm and dry.     Assessment/Plan 64 year old gentleman presents for decompression stabilization at L3-4, L4-5, L5-S1.  William Dunlap 09/22/2014, 11:01 AM

## 2014-09-22 NOTE — Anesthesia Preprocedure Evaluation (Signed)
Anesthesia Evaluation  Patient identified by MRN, date of birth, ID band Patient awake    Reviewed: Allergy & Precautions, NPO status , Patient's Chart, lab work & pertinent test results  Airway Mallampati: II  TM Distance: >3 FB Neck ROM: Full    Dental no notable dental hx.    Pulmonary neg pulmonary ROS,  breath sounds clear to auscultation  Pulmonary exam normal       Cardiovascular negative cardio ROS Normal cardiovascular examRhythm:Regular Rate:Normal     Neuro/Psych negative neurological ROS  negative psych ROS   GI/Hepatic Neg liver ROS, GERD-  Medicated,  Endo/Other  negative endocrine ROS  Renal/GU negative Renal ROS     Musculoskeletal  (+) Arthritis -,   Abdominal (+) + obese,   Peds  Hematology negative hematology ROS (+)   Anesthesia Other Findings   Reproductive/Obstetrics negative OB ROS                             Anesthesia Physical Anesthesia Plan  ASA: II  Anesthesia Plan: General   Post-op Pain Management:    Induction: Intravenous  Airway Management Planned: Oral ETT  Additional Equipment:   Intra-op Plan:   Post-operative Plan: Extubation in OR  Informed Consent: I have reviewed the patients History and Physical, chart, labs and discussed the procedure including the risks, benefits and alternatives for the proposed anesthesia with the patient or authorized representative who has indicated his/her understanding and acceptance.   Dental advisory given  Plan Discussed with: CRNA  Anesthesia Plan Comments:         Anesthesia Quick Evaluation

## 2014-09-23 MED ORDER — DOCUSATE SODIUM 100 MG PO CAPS: 100.0000 mg | ORAL_CAPSULE | Freq: Two times a day (BID) | ORAL | Status: DC

## 2014-09-23 MED ORDER — DOCUSATE SODIUM 100 MG PO CAPS
100.0000 mg | ORAL_CAPSULE | Freq: Two times a day (BID) | ORAL | Status: DC
  Administered 2014-09-23 – 2014-09-25 (×3): 100 mg via ORAL
  Filled 2014-09-23 (×3): qty 1

## 2014-09-23 NOTE — Progress Notes (Signed)
Subjective: Patient reports No leg pain back pain well-controlled  Objective: Vital signs in last 24 hours: Temp:  [98.2 F (36.8 C)-99.7 F (37.6 C)] 98.8 F (37.1 C) (06/02 0641) Pulse Rate:  [61-85] 85 (06/02 0641) Resp:  [11-20] 18 (06/02 0641) BP: (100-173)/(41-102) 100/41 mmHg (06/02 0641) SpO2:  [95 %-100 %] 96 % (06/02 0641) Weight:  [117.028 kg (258 lb)] 117.028 kg (258 lb) (06/01 0924)  Intake/Output from previous day: 06/01 0701 - 06/02 0700 In: 4375 [I.V.:3550; Blood:825] Out: 3160 [Urine:1105; Drains:455; Blood:1600] Intake/Output this shift:    Strength 5 out of 5 wound clean dry and intact  Lab Results: No results for input(s): WBC, HGB, HCT, PLT in the last 72 hours. BMET No results for input(s): NA, K, CL, CO2, GLUCOSE, BUN, CREATININE, CALCIUM in the last 72 hours.  Studies/Results: Dg Lumbar Spine 2-3 Views  09/22/2014   CLINICAL DATA:  Lumbar fusion.  EXAM: DG C-ARM GT 120 MIN; LUMBAR SPINE - 2-3 VIEW  COMPARISON:  None.  FINDINGS: Intraoperative spot films demonstrate pedicle screws bilaterally at L3, L4, L5 and S1. Good position without complicating features. There are interbody fusion type cages at L3-4, L4-5 and L5-S1.  IMPRESSION: Fusion hardware in good position without complicating features.   Electronically Signed   By: Rudie MeyerP.  Gallerani M.D.   On: 09/22/2014 16:58   Dg C-arm Gt 120 Min  09/22/2014   CLINICAL DATA:  Lumbar fusion.  EXAM: DG C-ARM GT 120 MIN; LUMBAR SPINE - 2-3 VIEW  COMPARISON:  None.  FINDINGS: Intraoperative spot films demonstrate pedicle screws bilaterally at L3, L4, L5 and S1. Good position without complicating features. There are interbody fusion type cages at L3-4, L4-5 and L5-S1.  IMPRESSION: Fusion hardware in good position without complicating features.   Electronically Signed   By: Rudie MeyerP.  Gallerani M.D.   On: 09/22/2014 16:58    Assessment/Plan: Posterior day 1 from a PLIF L3-4, L4-5, L5-S1. Doing very well continue physical and  occupational therapy mobilized today  LOS: 1 day     Rawleigh Rode P 09/23/2014, 7:44 AM

## 2014-09-23 NOTE — Evaluation (Signed)
Physical Therapy Evaluation Patient Details Name: Rene KocherJones D Martindelcampo MRN: [id number] DOB: [birth year] Today's Date: 09/23/2014   History of Present Illness  s/p posterior lumbar fusion 3 level  Clinical Impression  Patient presents with problems listed below.  Will benefit from acute PT to maximize functional independence prior to discharge home with family assist.  Anticipate patient will progress well with PT.    Follow Up Recommendations No PT follow up;Supervision/Assistance - 24 hour    Equipment Recommendations  Rolling walker with 5" wheels    Recommendations for Other Services       Precautions / Restrictions Precautions Precautions: Back Precaution Booklet Issued: Yes (comment) Precaution Comments: Reviewed precautions and information re: mobility, etc Required Braces or Orthoses: Spinal Brace Spinal Brace: Lumbar corset;Applied in sitting position Restrictions Weight Bearing Restrictions: No      Mobility  Bed Mobility Overal bed mobility: Needs Assistance Bed Mobility: Rolling;Sidelying to Sit Rolling: Modified independent (Device/Increase time) Sidelying to sit: Supervision       General bed mobility comments: Verbal cues for log rolling technique.  Patient using rail for rolling.  Supervision for safety as patient moves to sitting.  Good technique.  Educated on donning brace.  Patient required mod assist to don back brace.  Transfers Overall transfer level: Needs assistance Equipment used: Rolling walker (2 wheeled) Transfers: Sit to/from Stand Sit to Stand: Min assist         General transfer comment: Verbal cues for hand placement and technique.  Attempted in am from recliner - patient with significant pain and unable to reach standing position.  In pm, patient able to move to standing from bed and 3-in-1 with min assist to steady as patient moved to standing.  Ambulation/Gait Ambulation/Gait assistance: Min assist Ambulation Distance (Feet): 64 Feet  (with 1 seated rest break) Assistive device: Rolling walker (2 wheeled) Gait Pattern/deviations: Step-through pattern;Decreased stride length;Trunk flexed Gait velocity: Decreased Gait velocity interpretation: Below normal speed for age/gender General Gait Details: Verbal cues for safe use of RW.  Cues to stand upright - patient with flexed posture.  Stairs            Wheelchair Mobility    Modified Rankin (Stroke Patients Only)       Balance                                             Pertinent Vitals/Pain Pain Assessment: 0-10 Pain Score: 7  Pain Location: back Pain Descriptors / Indicators: Aching Pain Intervention(s): Limited activity within patient's tolerance;Repositioned;Patient requesting pain meds-RN notified    Home Living Family/patient expects to be discharged to:: Private residence Living Arrangements: Alone Available Help at Discharge: Family;Available 24 hours/day Type of Home: House Home Access: Stairs to enter Entrance Stairs-Rails: None Entrance Stairs-Number of Steps: 1 (reports it is a high step) Home Layout: One level Home Equipment: Cane - single point;Bedside commode;Shower seat      Prior Function Level of Independence: Independent with assistive device(s)         Comments: cane for ambulation     Hand Dominance        Extremity/Trunk Assessment   Upper Extremity Assessment: Defer to OT evaluation           Lower Extremity Assessment: Generalized weakness         Communication   Communication: No difficulties  Cognition Arousal/Alertness: Awake/alert Behavior  During Therapy: WFL for tasks assessed/performed Overall Cognitive Status: Within Functional Limits for tasks assessed                      General Comments      Exercises General Exercises - Lower Extremity Ankle Circles/Pumps: AROM;Both;10 reps;Seated      Assessment/Plan    PT Assessment Patient needs continued PT  services  PT Diagnosis Difficulty walking;Acute pain;Generalized weakness   PT Problem List Decreased strength;Decreased activity tolerance;Decreased balance;Decreased mobility;Decreased knowledge of use of DME;Decreased knowledge of precautions;Obesity;Pain  PT Treatment Interventions DME instruction;Gait training;Stair training;Functional mobility training;Therapeutic activities;Patient/family education   PT Goals (Current goals can be found in the Care Plan section) Acute Rehab PT Goals Patient Stated Goal: Decrease pain PT Goal Formulation: With patient Time For Goal Achievement: 09/30/14 Potential to Achieve Goals: Good    Frequency Min 5X/week   Barriers to discharge        Co-evaluation               End of Session Equipment Utilized During Treatment: Gait belt;Back brace Activity Tolerance: Patient limited by pain Patient left: in chair;with call bell/phone within reach;with family/visitor present Nurse Communication: Mobility status;Patient requests pain meds (Limit time in chair to 45 min each time)         Time:10:33 - 10:38 and 5621-3086 PT Time Calculation (min) (ACUTE ONLY): 5 min + 26 min = 31 min total   Charges:   PT Evaluation $Initial PT Evaluation Tier I: 1 Procedure PT Treatments $Gait Training: 8-22 mins   PT G Codes:        Vena Austria 10/15/2014, 9:05 PM Durenda Hurt. Renaldo Fiddler, [facility] Acute Rehab Services Pager [phone]

## 2014-09-23 NOTE — Progress Notes (Signed)
Occupational Therapy Evaluation Patient Details Name: William Dunlap MRN: [id number] DOB: 12/10/1950 Today's Date: 09/23/2014    History of Present Illness s/p posterior lumbar fusion 3 level   Clinical Impression   Pt admitted with the above diagnoses and presents with below problem list. Pt will benefit from continued acute OT to address the below listed deficits and maximize independence with BADLs prior to d/c home with family. PTA pt was mod I with ADLs, using a cane. Pt is currently min-mod A with LB ADLs and sit<>stand transfers. Functional mobility not attempted this session due to pain impacting standing balance. Pain a limiting a factor this session. OT to continue to follow acutely.      Follow Up Recommendations  Supervision/Assistance - 24 hour;No OT follow up    Equipment Recommendations  None recommended by OT    Recommendations for Other Services PT consult     Precautions / Restrictions Precautions Precautions: Back Precaution Booklet Issued: Yes (comment) Precaution Comments: reviewed BAT precautions, pt able to verbalize 3/3 precautions with teachback Required Braces or Orthoses: Spinal Brace Spinal Brace: Lumbar corset;Applied in sitting position Restrictions Weight Bearing Restrictions: No      Mobility Bed Mobility               General bed mobility comments: educated on log rolling technique  Transfers Overall transfer level: Needs assistance Equipment used: Rolling walker (2 wheeled) Transfers: Sit to/from Stand Sit to Stand: Min assist         General transfer comment: from recliner.    Balance Overall balance assessment: Needs assistance Sitting-balance support: Feet supported Sitting balance-Leahy Scale: Fair     Standing balance support: Bilateral upper extremity supported;During functional activity Standing balance-Leahy Scale: Poor Standing balance comment: stood about 1 minute before needing to sit down                             ADL Overall ADL's : Needs assistance/impaired Eating/Feeding: Set up;Sitting   Grooming: Set up;Sitting   Upper Body Bathing: Sitting;Min guard;With adaptive equipment   Lower Body Bathing: Moderate assistance;Sit to/from stand   Upper Body Dressing : Sitting;Min guard;Minimal assistance   Lower Body Dressing: Moderate assistance;Sit to/from stand   Toilet Transfer: Stand-pivot;BSC;RW;Minimal assistance   Toileting- Architect and Hygiene: Moderate assistance;Sit to/from stand         General ADL Comments: Pt completed sit<>stand from recliner with min A for power up and balance and to control descent. Pt stood about 1 minute before requesting and initiating sitting down due to increased pain impacting standing balance. ADL and AE education provided included back handout. Pt's family present for session and will be staying with pt at d/c.Session limited by pain.Pt reports ambulating with assist from bed to recliner this morning with some difficulty.      Vision     Perception     Praxis      Pertinent Vitals/Pain Pain Assessment: 0-10 Pain Score: 7  Pain Location: back, increased from 6 to 7 after static standing 1 minute. Pain Descriptors / Indicators: Aching;Moaning;Grimacing Pain Intervention(s): Limited activity within patient's tolerance;Monitored during session;Premedicated before session;Repositioned;Other (comment) (Pt recently taken oral pain meds.)     Hand Dominance     Extremity/Trunk Assessment Upper Extremity Assessment Upper Extremity Assessment: Overall WFL for tasks assessed   Lower Extremity Assessment Lower Extremity Assessment: Defer to PT evaluation       Communication Communication Communication: No difficulties  Cognition Arousal/Alertness: Awake/alert Behavior During Therapy: WFL for tasks assessed/performed Overall Cognitive Status: Within Functional Limits for tasks assessed                      General Comments       Exercises       Shoulder Instructions      Home Living Family/patient expects to be discharged to:: Private residence Living Arrangements: Alone Available Help at Discharge: Family;Available 24 hours/day Type of Home: House Home Access: Stairs to enter Enter[employer] CorporationEntrance Stairs-Number of Steps: 1 (pt reports its a high step) Entrance Stairs-Rails: None Home Layout: One level     Bathroom Shower/Tub: Chief Strategy OfficerTub/shower unit   Bathroom Toilet: Standard     Home Equipment: Cane - single point;Bedside commode;Shower seat          Prior Functioning/Environment Level of Independence: Independent with assistive device(s)        Comments: cane for ambulation    OT Diagnosis: Acute pain   OT Problem List: Decreased activity tolerance;Impaired balance (sitting and/or standing);Decreased knowledge of use of DME or AE;Decreased knowledge of precautions;Pain   OT Treatment/Interventions: Self-care/ADL training;Energy conservation;DME and/or AE instruction;Therapeutic activities;Patient/family education;Balance training    OT Goals(Current goals can be found in the care plan section) Acute Rehab OT Goals Patient Stated Goal: not stated OT Goal Formulation: With patient Time For Goal Achievement: 09/30/14 Potential to Achieve Goals: Good ADL Goals Pt Will Perform Lower Body Dressing: with min guard assist;with adaptive equipment;sit to/from stand Pt Will Transfer to Toilet: with min guard assist;ambulating;bedside commode Pt Will Perform Toileting - Clothing Manipulation and hygiene: with min guard assist;with adaptive equipment;sit to/from stand Pt Will Perform Tub/Shower Transfer: with min guard assist;ambulating;shower seat;rolling walker  OT Frequency: Min 2X/week   Barriers to D/C:            Co-evaluation              End of Session Equipment Utilized During Treatment: Gait belt;Rolling walker;Back brace Nurse Communication: Mobility status;Other  (comment) (increased pain during session)  Activity Tolerance: Patient limited by pain Patient left: in chair;with call bell/phone within reach;with chair alarm set;with family/visitor present   Time: [id number] OT Time Calculation (min): 20 min Charges:  OT General Charges $OT Visit: 1 Procedure OT Evaluation $Initial OT Evaluation Tier I: 1 Procedure G-Codes:    Pilar GrammesMathews, Zhana Jeangilles H 09/23/2014, 10:48 AM

## 2014-09-23 NOTE — Progress Notes (Deleted)
Pt is being discharged home. Discharge instructions were given to patient and family 

## 2014-09-23 NOTE — Progress Notes (Signed)
Pt arrived to unit via stretcher from the PACU.  Pt alert and oriented and complaining of back pain 9/10.  Honeycomb dressing clean dry and intact and pt able to move all extremities.  Pt oriented to unit, staff and plan of care.  Call bell within reach, bed alarm on and family at bedside.  Will continue to monitor.

## 2014-09-24 ENCOUNTER — Encounter (HOSPITAL_COMMUNITY): Payer: Self-pay | Admitting: Neurosurgery

## 2014-09-24 MED ORDER — SENNA 8.6 MG PO TABS
1.0000 | ORAL_TABLET | Freq: Two times a day (BID) | ORAL | Status: DC
  Administered 2014-09-25: 8.6 mg via ORAL
  Filled 2014-09-24: qty 1

## 2014-09-24 MED FILL — Heparin Sodium (Porcine) Inj 1000 Unit/ML: INTRAMUSCULAR | Qty: 30 | Status: AC

## 2014-09-24 MED FILL — Sodium Chloride Irrigation Soln 0.9%: Qty: 3000 | Status: AC

## 2014-09-24 MED FILL — Sodium Chloride IV Soln 0.9%: INTRAVENOUS | Qty: 1000 | Status: AC

## 2014-09-24 NOTE — Progress Notes (Signed)
Subjective: Patient reports Doing well no leg pain still some numbness in his feet but improved from preop.  Objective: Vital signs in last 24 hours: Temp:  [97.5 F (36.4 C)-98.5 F (36.9 C)] 98.5 F (36.9 C) (06/03 0546) Pulse Rate:  [68-94] 76 (06/03 0546) Resp:  [16-20] 18 (06/03 0546) BP: (102-127)/(43-69) 102/43 mmHg (06/03 0546) SpO2:  [93 %-100 %] 95 % (06/03 0546)  Intake/Output from previous day: 06/02 0701 - 06/03 0700 In: -  Out: 440 [Drains:440] Intake/Output this shift:    Strength out of 5 wound clean dry and intact  Lab Results: No results for input(s): WBC, HGB, HCT, PLT in the last 72 hours. BMET No results for input(s): NA, K, CL, CO2, GLUCOSE, BUN, CREATININE, CALCIUM in the last 72 hours.  Studies/Results: Dg Lumbar Spine 2-3 Views  09/22/2014   CLINICAL DATA:  Lumbar fusion.  EXAM: DG C-ARM GT 120 MIN; LUMBAR SPINE - 2-3 VIEW  COMPARISON:  None.  FINDINGS: Intraoperative spot films demonstrate pedicle screws bilaterally at L3, L4, L5 and S1. Good position without complicating features. There are interbody fusion type cages at L3-4, L4-5 and L5-S1.  IMPRESSION: Fusion hardware in good position without complicating features.   Electronically Signed   By: Rudie MeyerP.  Gallerani M.D.   On: 09/22/2014 16:58   Dg C-arm Gt 120 Min  09/22/2014   CLINICAL DATA:  Lumbar fusion.  EXAM: DG C-ARM GT 120 MIN; LUMBAR SPINE - 2-3 VIEW  COMPARISON:  None.  FINDINGS: Intraoperative spot films demonstrate pedicle screws bilaterally at L3, L4, L5 and S1. Good position without complicating features. There are interbody fusion type cages at L3-4, L4-5 and L5-S1.  IMPRESSION: Fusion hardware in good position without complicating features.   Electronically Signed   By: Rudie MeyerP.  Gallerani M.D.   On: 09/22/2014 16:58    Assessment/Plan: Continue to mobilize with physical occupational therapy possible discharge tomorrow  LOS: 2 days     William Dunlap P 09/24/2014, 7:23 AM

## 2014-09-24 NOTE — Progress Notes (Signed)
Occupational Therapy Treatment Patient Details Name: William Dunlap D Helbert MRN: [id number] DOB: Apr 12, 1951 Today's Date: 09/24/2014    History of present illness s/p posterior lumbar fusion 3 level   OT comments  Pt sitting in chair upon OT arrival. Pt reports pain is 3/10 sitting but increases to 6/10 upon standing. Pt able to verbalize 3/3 precautions. Toileting and grooming activities completed in bathroom. Pt reports pain decreases to 3/10 when walking. Pt able to shave standing at sink but required sitting for additional grooming tasks due to LE weakness and pain. Pt required cues to maintain precautions during task. Education provided on maintaining precautions during ADLs. Pt would benefit from continued OT services to address safety and maintaining precautions during ADLs at home.  Follow Up Recommendations  Supervision/Assistance - 24 hour;No OT follow up    Equipment Recommendations       Recommendations for Other Services      Precautions / Restrictions Precautions Precautions: Back Precaution Booklet Issued: Yes (comment) Precaution Comments: reviewed precautions, pt able to verbalize 3/3 Required Braces or Orthoses: Spinal Brace Spinal Brace: Lumbar corset;Applied in sitting position Restrictions Weight Bearing Restrictions: No       Mobility Bed Mobility               General bed mobility comments: Pt in chair upon arrival  Transfers Overall transfer level: Needs assistance Equipment used: Rolling walker (2 wheeled) Transfers: Sit to/from Stand Sit to Stand: Min assist         General transfer comment: Pt demonstrated appropriate hand placement duing sit<>stand from recliner and 3in1    Balance Overall balance assessment: Needs assistance Sitting-balance support: Feet supported       Standing balance support: Single extremity supported;During functional activity   Standing balance comment: 5 min standing at sink for shaving, uses L UE for support to  maintain balance during functional activities                   ADL Overall ADL's : Needs assistance/impaired Eating/Feeding: Independent;Sitting   Grooming: Wash/dry hands;Wash/dry face;Oral care;Applying deodorant;Brushing hair;Min guard;Sitting (cuing to maintain precautions) Grooming Details (indicate cue type and reason): Pt completed grooming at sink, standing to shave, sitting for oral care and hair washing         Upper Body Dressing : Sitting;Supervision/safety Upper Body Dressing Details (indicate cue type and reason): Education provided on wearing shirt/ gown under brace     Toilet Transfer: Min guard;BSC;RW   Toileting- Clothing Manipulation and Hygiene: Minimal assistance;Sit to/from stand       Functional mobility during ADLs: Minimal assistance;Rolling walker General ADL Comments: Pt completed toileting and grooming activities in bathroom. Pt able to stand while shaving, but asked to sit for further grooming tasks due to back pain 6/10 in standing. Pt required cues to maintain precautions during tasks. Pt demonstrated ability to don and doff brace with supervision.      Vision                     Perception     Praxis      Cognition   Behavior During Therapy: [facility] NorthWFL for tasks assessed/performed Overall Cognitive Status: Within Functional Limits for tasks assessed                       Extremity/Trunk Assessment               Exercises     Shoulder Instructions  General Comments      Pertinent Vitals/ Pain       Pain Assessment: 0-10 Pain Score: 6  Pain Location: back, 3/10 sitting, 6/10 standing Pain Descriptors / Indicators: Aching Pain Intervention(s): Monitored during session;Repositioned;Limited activity within patient's tolerance  Home Living                                          Prior Functioning/Environment              Frequency Min 2X/week     Progress Toward Goals  OT  Goals(current goals can now be found in the care plan section)  Progress towards OT goals: Progressing toward goals  Acute Rehab OT Goals Patient Stated Goal: to get cleaned up OT Goal Formulation: With patient Time For Goal Achievement: 09/30/14 Potential to Achieve Goals: Good ADL Goals Pt Will Perform Lower Body Dressing: with min guard assist;with adaptive equipment;sit to/from stand Pt Will Transfer to Toilet: with min guard assist;ambulating;bedside commode Pt Will Perform Toileting - Clothing Manipulation and hygiene: with min guard assist;with adaptive equipment;sit to/from stand Pt Will Perform Tub/Shower Transfer: with min guard assist;ambulating;shower seat;rolling walker  Plan Discharge plan remains appropriate    Co-evaluation                 End of Session Equipment Utilized During Treatment: Gait belt;Rolling walker;Back brace   Activity Tolerance Patient tolerated treatment well;Patient limited by pain   Patient Left in chair;with call bell/phone within reach;with chair alarm set   Nurse Communication Mobility status        Time: 9147-8295 OT Time Calculation (min): 31 min  Charges: OT General Charges $OT Visit: 1 Procedure OT Treatments $Self Care/Home Management : 23-37 mins  Marden Noble 09/24/2014, 10:34 AM

## 2014-09-24 NOTE — Progress Notes (Signed)
Physical Therapy Treatment Patient Details Name: William Dunlap MRN: [id number] DOB: 1950-08-04 Today's Date: 09/24/2014    History of Present Illness s/p posterior lumbar fusion 3 level    PT Comments    Patient with improved mobility and gait today.  Will attempt stairs in am.  Follow Up Recommendations  No PT follow up;Supervision/Assistance - 24 hour     Equipment Recommendations  Rolling walker with 5" wheels    Recommendations for Other Services       Precautions / Restrictions Precautions Precautions: Back Precaution Booklet Issued: Yes (comment) Precaution Comments: reviewed precautions, pt able to verbalize 3/3 Required Braces or Orthoses: Spinal Brace Spinal Brace: Lumbar corset;Applied in sitting position Restrictions Weight Bearing Restrictions: No    Mobility  Bed Mobility               General bed mobility comments: Pt in chair upon arrival  Transfers Overall transfer level: Needs assistance Equipment used: Rolling walker (2 wheeled) Transfers: Sit to/from Stand Sit to Stand: Min guard         General transfer comment: Good technique noted.  [place] on UE's to power up to standing.  Ambulation/Gait Ambulation/Gait assistance: Min guard Ambulation Distance (Feet): 180 Feet Assistive device: Rolling walker (2 wheeled) Gait Pattern/deviations: Step-through pattern;Decreased stride length;Trunk flexed Gait velocity: Decreased Gait velocity interpretation: Below normal speed for age/gender General Gait Details: Cues to stay close to RW and stand upright.   Stairs            Wheelchair Mobility    Modified Rankin (Stroke Patients Only)       Balance Overall balance assessment: Needs assistance Sitting-balance support: Feet supported       Standing balance support: Single extremity supported;During functional activity   Standing balance comment: 5 min standing at sink for shaving, uses L UE for support to maintain balance  during functional activities                    Cognition Arousal/Alertness: Awake/alert Behavior During Therapy: WFL for tasks assessed/performed Overall Cognitive Status: Within Functional Limits for tasks assessed                      Exercises      General Comments        Pertinent Vitals/Pain Pain Assessment: 0-10 Pain Score: 3  Pain Location: back Pain Descriptors / Indicators: Aching;Sore Pain Intervention(s): Monitored during session;Repositioned    Home Living                      Prior Function            PT Goals (current goals can now be found in the care plan section) Acute Rehab PT Goals Patient Stated Goal: to get cleaned up Progress towards PT goals: Progressing toward goals    Frequency  Min 5X/week    PT Plan Current plan remains appropriate    Co-evaluation             End of Session Equipment Utilized During Treatment: Gait belt;Back brace Activity Tolerance: Patient tolerated treatment well Patient left: in chair;with call bell/phone within reach;with chair alarm set;with family/visitor present     Time: 4098-1191 PT Time Calculation (min) (ACUTE ONLY): 13 min  Charges:  $Gait Training: 8-22 mins                    G Codes:      Earlene Plater,  Durenda HurtSusan H 09/24/2014, 11:41 AM Durenda HurtSusan H. Renaldo Fiddleravis, PT, [facility] Acute Rehab Services Pager [phone]

## 2014-09-25 MED ORDER — OXYCODONE-ACETAMINOPHEN 5-325 MG PO TABS: 1.0000 | ORAL_TABLET | ORAL | Status: DC | PRN

## 2014-09-25 NOTE — Progress Notes (Addendum)
Occupational Therapy Treatment Patient Details Name: William Dunlap MRN: [id number] DOB: 12/09/1950 Today's Date: 09/25/2014    History of present illness s/p posterior lumbar fusion 3 level   OT comments  Education provided in session. Recommending HHOT upon d/c and notified nurse.  Follow Up Recommendations  Home health OT;Supervision - Intermittent    Equipment Recommendations  Other (comment) (AE)    Recommendations for Other Services      Precautions / Restrictions Precautions Precautions: Back Precaution Booklet Issued: No (one in room) Precaution Comments: pt able to state 2/3 back precautions Required Braces or Orthoses: Spinal Brace Spinal Brace: Lumbar corset;Applied in sitting position;Other (comment) (adjusted in standing) Restrictions Weight Bearing Restrictions: No       Mobility Bed Mobility               General bed mobility comments: not assessed  Transfers Overall transfer level: Needs assistance Transfers: Sit to/from Stand Sit to Stand: Min guard;Supervision            Balance Unsteady when standing on one occasion-Min guard.                      ADL Overall ADL's : Needs assistance/impaired                 Upper Body Dressing : Minimal assistance;Sitting;Standing Upper Body Dressing Details (indicate cue type and reason): assisted in adjusting brace; pt also donned shirt/doffed robe Lower Body Dressing: Sit to/from stand;With adaptive equipment;Moderate assistance   Toilet Transfer: Min guard;Ambulation;RW (3 in 1 over commode)   Toileting- Clothing Manipulation and Hygiene: Supervision/safety (standing-moved gown)       Functional mobility during ADLs: Min guard;Rolling walker General ADL Comments: Educated on safety tips such as sitting for most of LB ADLs, safe footwear, rugs, and recommended someone be with him for tub transfer. Educated on multiple options for tub transfer and options for shower chair. Educated  on AE and pt practiced wtih this. Discussed incorpating precautions with functional activties such as use of cup for oral care, placement of grooming items and not reaching above shoulder level.  Educated on back brace and what pt could use for toielt aide for hygiene if needed (suggested wipes).  OT planning to switch out pt's sock aid for larger size as well as a longer shoehorn. OT talked with family member about where to get AE kit/cost.       Vision                     Perception     Praxis      Cognition  Awake/Alert Behavior During Therapy: WFL for tasks assessed/performed Overall Cognitive Status: Within Functional Limits for tasks assessed                       Extremity/Trunk Assessment               Exercises     Shoulder Instructions       General Comments      Pertinent Vitals/ Pain       Pain Assessment: 0-10 Pain Score: 7  Pain Location: back Pain Descriptors / Indicators: Aching;Other (Comment) (stiffness) Pain Intervention(s): Monitored during session  Home Living  Prior Functioning/Environment              Frequency Min 2X/week     Progress Toward Goals  OT Goals(current goals can now be found in the care plan section)  Progress towards OT goals: Progressing toward goals  Acute Rehab OT Goals Patient Stated Goal: be independent OT Goal Formulation: With patient Time For Goal Achievement: 09/30/14 Potential to Achieve Goals: Good ADL Goals Pt Will Perform Lower Body Dressing: with min guard assist;with adaptive equipment;sit to/from stand Pt Will Transfer to Toilet: with min guard assist;ambulating;bedside commode Pt Will Perform Toileting - Clothing Manipulation and hygiene: with min guard assist;with adaptive equipment;sit to/from stand Pt Will Perform Tub/Shower Transfer: with min guard assist;ambulating;shower seat;rolling walker  Plan Discharge plan needs  to be updated    Co-evaluation                 End of Session Equipment Utilized During Treatment: Rolling walker;Gait belt;Back brace   Activity Tolerance Patient tolerated treatment well   Patient Left in chair;with family/visitor present   Nurse Communication Other (comment) (recommending [place])        Time: [phone] (time spent urinating and talking to someone on the phone-approximately 3 min?) OT Time Calculation (min): 38 min  Charges: OT General Charges $OT Visit: 1 Procedure OT Treatments $Self Care/Home Management : 23-37 mins  Benito Mccreedy OTR/L [phone] 09/25/2014, 10:28 AM

## 2014-09-25 NOTE — Progress Notes (Signed)
Physical Therapy Treatment Patient Details Name: William Dunlap MRN: [id number] DOB: 1950-07-11 Today's Date: 09/25/2014    History of Present Illness s/p posterior lumbar fusion 3 level    PT Comments    Patient ready for discharge home.  [name]  Follow Up Recommendations  Supervision/Assistance - 24 hour;Home health PT     Equipment Recommendations  Rolling walker with 5" wheels    Recommendations for Other Services       Precautions / Restrictions Precautions Precautions: Back Precaution Booklet Issued: Yes (comment) (reviewed) Precaution Comments: reviewed precautions, pt able to verbalize 3/3 Required Braces or Orthoses: Spinal Brace Spinal Brace: Lumbar corset;Other (comment) (pt wearing upon entering the room) Restrictions Weight Bearing Restrictions: No    Mobility  Bed Mobility               General bed mobility comments: OOB upon arrival  Transfers Overall transfer level: Needs assistance Equipment used: Rolling walker (2 wheeled) Transfers: Sit to/from Stand Sit to Stand: Supervision         General transfer comment: incr time for sit to stand from [facility], difficulty coming into erect posture  Ambulation/Gait Ambulation/Gait assistance: Supervision Ambulation Distance (Feet): 200 Feet (x 2) Assistive device: Rolling walker (2 wheeled) Gait Pattern/deviations: Trunk flexed;Decreased step length - left;Decreased stance time - right Gait velocity: Decreased   General Gait Details: Cues to stay close to RW and stand upright.   Stairs Stairs: Yes Stairs assistance: Min guard Stair Management: No rails;Forwards;With walker Number of Stairs: 1 (x 2 trials) General stair comments: min cues for walker placement  Wheelchair Mobility    Modified Rankin  (Stroke Patients Only)       Balance Overall balance assessment: Modified Independent Sitting-balance support: Feet supported;No upper extremity supported Sitting balance-Leahy Scale: Good     Standing balance support: Bilateral upper extremity supported Standing balance-Leahy Scale: Fair                      Cognition Arousal/Alertness: Awake/alert Behavior During Therapy: WFL for tasks assessed/performed Overall Cognitive Status: Within Functional Limits for tasks assessed                      Exercises      General Comments General comments (skin integrity, edema, etc.): hemovac      Pertinent Vitals/Pain Pain Assessment: 0-10 Pain Score: 6  Pain Location: back, bil buttocks Rt>Lt Pain Descriptors / Indicators: Sore;Aching;Spasm Pain Intervention(s): Limited activity within patient's tolerance;Monitored during session;Patient requesting pain meds-RN notified    Home Living                      Prior Function            PT Goals (current goals can now be found in the care plan section) Acute Rehab PT Goals Patient Stated Goal: manage pain in back PT Goal Formulation: With patient Time For Goal Achievement: 09/30/14 Potential to Achieve Goals: Good Progress towards PT goals: Goals met/education completed, patient discharged from PT    Frequency  Min 5X/week    PT Plan Current plan remains appropriate    Co-evaluation             End of Session Equipment Utilized During Treatment:  Back brace Activity Tolerance: Patient tolerated treatment well Patient left: in chair;with call bell/phone within reach     Time: 1994-1290 PT Time Calculation (min) (ACUTE ONLY): 33 min  Charges:  $Gait Training: 23-37 mins                    G CodesMalka So, [place] [phone]  [place] 09/25/2014, 9:28 AM

## 2014-09-25 NOTE — Discharge Summary (Signed)
  Physician Discharge Summary  Patient ID: Rene KocherJones D Steffensmeier MRN: [id number] DOB/AGE: Jan 17, 1951 64 y.o.  Admit date: 09/22/2014 Discharge date: 09/25/2014  Admission Diagnoses:  Discharge Diagnoses:  Active Problems:   Spondylolisthesis at L5-S1 level   Discharged Condition: good  Hospital Course: Surgery 3 days ago for multi level plif. Did well. Steadily increased activity. Wound healing well. Home pod 3, specific instructions given.  Consults: None  Significant Diagnostic Studies: none  Treatments: surgery: L3 to S1 fusion  Discharge Exam: Blood pressure 102/46, pulse 79, temperature 98.7 F (37.1 C), temperature source Oral, resp. rate 20, height 5\' 10"  (1.778 m), weight 117.028 kg (258 lb), SpO2 98 %. Incision/Wound:clean and dry  Disposition: Final discharge disposition not confirmed     Medication List    ASK your doctor about these medications        CO Q 10 PO  Take 1 tablet by mouth daily.     ibuprofen 200 MG tablet  Commonly known as:  ADVIL,MOTRIN  Take 400 mg by mouth every 8 (eight) hours as needed for mild pain or moderate pain.     methadone 10 MG tablet  Commonly known as:  DOLOPHINE  Take 40 mg by mouth daily.     multivitamin with minerals tablet  Take 1 tablet by mouth daily.     omeprazole 20 MG capsule  Commonly known as:  PRILOSEC  Take 20 mg by mouth daily.         At home rest most of the time. Get up 9 or 10 times each day and take a 15 or 20 minute walk. No riding in the car and to your first postoperative appointment. If you have neck surgery you may shower from the chest down starting on the third postoperative day. If you had back surgery he may start showering on the third postoperative day with saran wrap wrapped around your incisional area 3 times. After the shower remove the saran wrap. Take pain medicine as needed and other medications as instructed. Call my office for an appointment.  SignedReinaldo Meeker: Prapti Grussing O, MD 09/25/2014,  8:05 AM

## 2014-09-25 NOTE — Progress Notes (Signed)
Patient is being d/c. D/c instructions given and patient verbalized understanding. Patient left with all belongings at the bedside. Condition stable.

## 2014-09-27 ENCOUNTER — Encounter (HOSPITAL_COMMUNITY): Payer: Self-pay | Admitting: Neurosurgery

## 2014-12-31 ENCOUNTER — Other Ambulatory Visit: Payer: Self-pay | Admitting: Neurosurgery

## 2014-12-31 DIAGNOSIS — M5137 Other intervertebral disc degeneration, lumbosacral region: Secondary | ICD-10-CM

## 2015-01-06 ENCOUNTER — Ambulatory Visit
Admission: RE | Admit: 2015-01-06 | Discharge: 2015-01-06 | Disposition: A | Discharge status: Home / Self Care | Payer: Medicare HMO | Source: Ambulatory Visit | Attending: Neurosurgery | Admitting: Neurosurgery

## 2015-01-06 DIAGNOSIS — M5137 Other intervertebral disc degeneration, lumbosacral region: Secondary | ICD-10-CM

## 2016-06-01 IMAGING — RF DG C-ARM GT 120 MIN
1 series · 2 of 2 positions shown · non-contrast
Comparison: None.

CLINICAL DATA: Lumbar fusion.

EXAM:
DG C-ARM GT 120 MIN; LUMBAR SPINE - 2-3 VIEW

[Series 1: run · 2 of 2 slices shown]
[im 1/2]
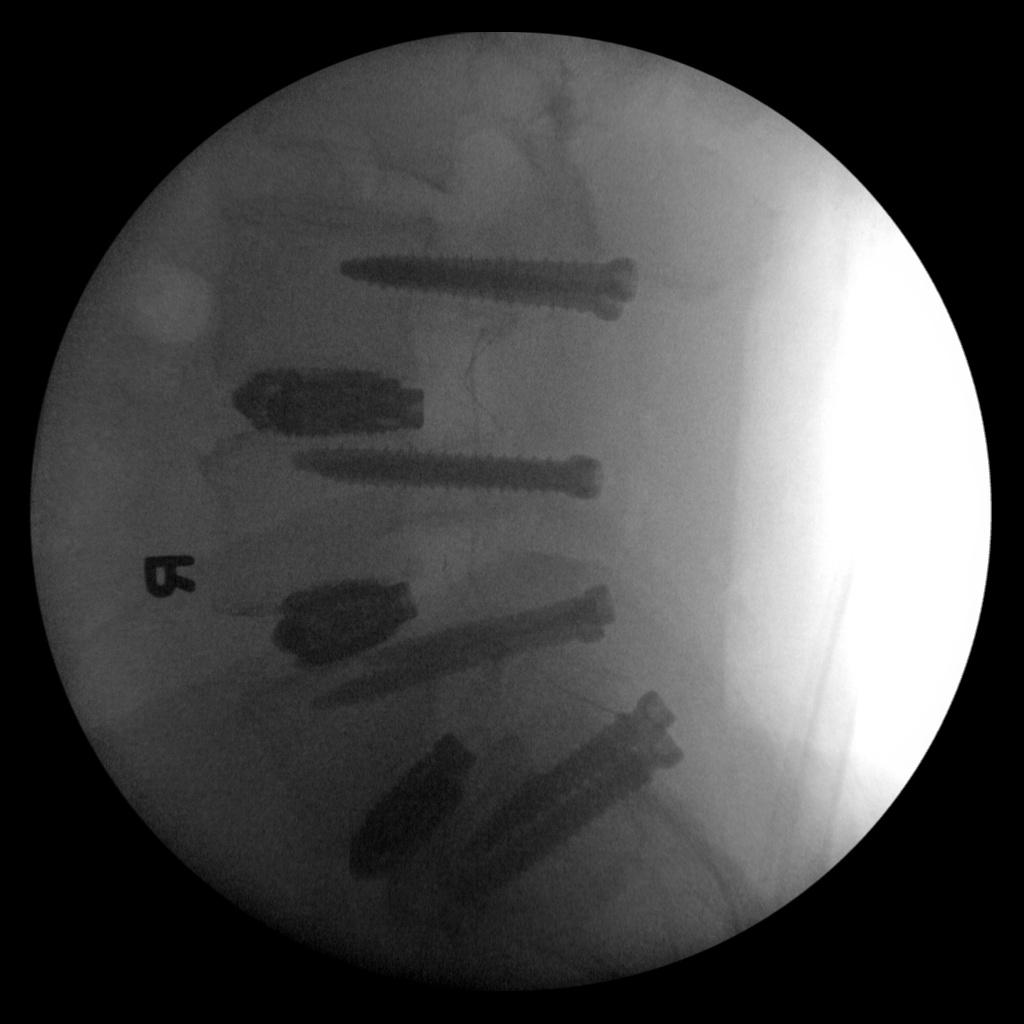
[im 2/2]
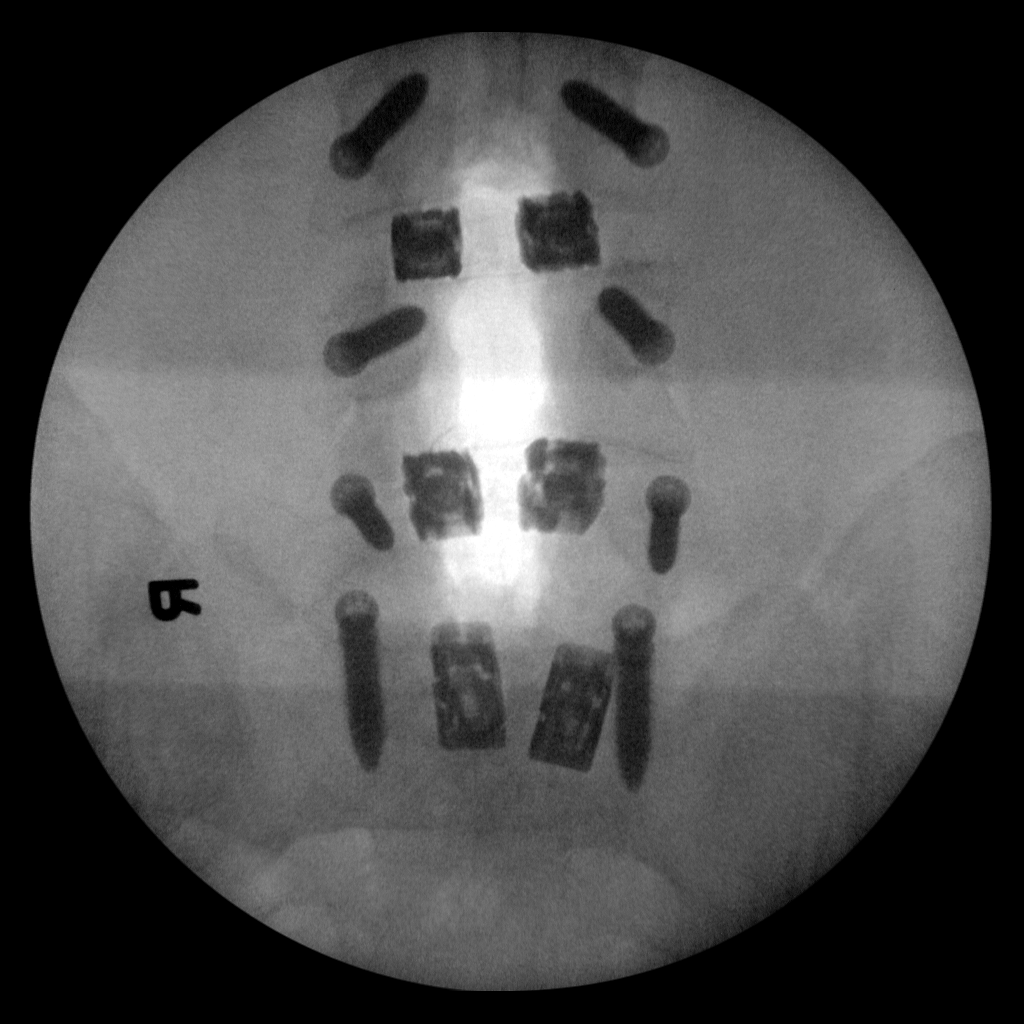

[2 of 2 positions shown; findings below may reference images not displayed]

FINDINGS: Intraoperative spot films demonstrate pedicle screws bilaterally at
L3, L4, L5 and S1. Good position without complicating features.
There are interbody fusion type cages at L3-4, L4-5 and L5-S1.
IMPRESSION: Fusion hardware in good position without complicating features.

## 2019-02-10 ENCOUNTER — Other Ambulatory Visit: Payer: Self-pay | Admitting: Neurosurgery

## 2019-02-10 DIAGNOSIS — M961 Postlaminectomy syndrome, not elsewhere classified: Secondary | ICD-10-CM

## 2019-03-12 ENCOUNTER — Other Ambulatory Visit: Payer: Self-pay

## 2019-03-12 ENCOUNTER — Ambulatory Visit
Admission: RE | Admit: 2019-03-12 | Discharge: 2019-03-12 | Disposition: A | Discharge status: Home / Self Care | Payer: Medicare HMO | Source: Ambulatory Visit | Attending: Neurosurgery | Admitting: Neurosurgery

## 2019-03-12 DIAGNOSIS — M961 Postlaminectomy syndrome, not elsewhere classified: Secondary | ICD-10-CM

## 2020-02-24 ENCOUNTER — Other Ambulatory Visit: Payer: Self-pay | Admitting: *Deleted

## 2020-02-24 ENCOUNTER — Encounter: Payer: Self-pay | Admitting: *Deleted

## 2020-02-24 NOTE — Progress Notes (Signed)
error 

## 2020-06-06 ENCOUNTER — Other Ambulatory Visit: Payer: Self-pay | Admitting: *Deleted

## 2020-06-06 DIAGNOSIS — M25561 Pain in right knee: Secondary | ICD-10-CM

## 2020-06-06 DIAGNOSIS — M25562 Pain in left knee: Secondary | ICD-10-CM

## 2020-06-23 ENCOUNTER — Encounter: Payer: Medicare PPO | Admitting: Vascular Surgery

## 2020-06-23 ENCOUNTER — Encounter (HOSPITAL_COMMUNITY): Payer: Medicare PPO

## 2020-07-21 ENCOUNTER — Ambulatory Visit (HOSPITAL_COMMUNITY)
Admission: RE | Admit: 2020-07-21 | Discharge: 2020-07-21 | Disposition: A | Discharge status: Home / Self Care | Payer: Medicare Other | Source: Ambulatory Visit | Attending: Vascular Surgery | Admitting: Vascular Surgery

## 2020-07-21 ENCOUNTER — Encounter: Payer: Self-pay | Admitting: Vascular Surgery

## 2020-07-21 ENCOUNTER — Ambulatory Visit (INDEPENDENT_AMBULATORY_CARE_PROVIDER_SITE_OTHER): Payer: Medicare Other | Admitting: Vascular Surgery

## 2020-07-21 ENCOUNTER — Other Ambulatory Visit: Payer: Self-pay

## 2020-07-21 VITALS — BP 147/82 | HR 84 | Temp 98.2°F | Resp 20 | Ht 70.0 in | Wt 298.3 lb

## 2020-07-21 DIAGNOSIS — M25561 Pain in right knee: Secondary | ICD-10-CM | POA: Diagnosis not present

## 2020-07-21 DIAGNOSIS — I83813 Varicose veins of bilateral lower extremities with pain: Secondary | ICD-10-CM

## 2020-07-21 DIAGNOSIS — M25562 Pain in left knee: Secondary | ICD-10-CM | POA: Diagnosis not present

## 2020-07-21 NOTE — Progress Notes (Signed)
Referring Physician: Dr Celine Mans  Patient name: William Dunlap MRN: [id number] DOB: 17-Nov-1950 Sex: male  REASON FOR CONSULT: Leg swelling with pain  HPI: William Dunlap is a 70 y.o. male, with a several year history of lower extremity aching fullness heaviness and swelling.  He does intermittently wear some knee-high compression stockings and get some relief from this.  He has no prior history of DVT.  He has had several back operations in the past and has some chronic numbness and tingling in his feet.  He does not describe lower extremity claudication or rest pain symptoms.    Past Medical History:  Diagnosis Date  . Arthritis   . COPD (chronic obstructive pulmonary disease) (HCC)   . GERD (gastroesophageal reflux disease)   . Stones in the urinary tract   . Umbilical hernia    Past Surgical History:  Procedure Laterality Date  . CHOLECYSTECTOMY    . HERNIA REPAIR  96   umbilical    History reviewed. No pertinent family history.  SOCIAL HISTORY: Social History   Socioeconomic History  . Marital status: Widowed    Spouse name: Not on file  . Number of children: Not on file  . Years of education: Not on file  . Highest education level: Not on file  Occupational History  . Not on file  Tobacco Use  . Smoking status: Never Smoker  . Smokeless tobacco: Current User  Vaping Use  . Vaping Use: Never used  Substance and Sexual Activity  . Alcohol use: No  . Drug use: No  . Sexual activity: Not on file  Other Topics Concern  . Not on file  Social History Narrative  . Not on file   Social Determinants of Health   Financial Resource Strain: Not on file  Food Insecurity: Not on file  Transportation Needs: Not on file  Physical Activity: Not on file  Stress: Not on file  Social Connections: Not on file  Intimate Partner Violence: Not on file    Allergies  Allergen Reactions  . Sulfa Antibiotics Rash    Current Outpatient Medications  Medication Sig Dispense  Refill  . buPROPion (WELLBUTRIN XL) 150 MG 24 hr tablet Take 150 mg by mouth every morning.    . Coenzyme Q10 (CO Q 10 PO) Take 1 tablet by mouth daily.    Marland Kitchen esomeprazole (NEXIUM) 40 MG capsule Take 40 mg by mouth daily.    . methadone (DOLOPHINE) 10 MG tablet methadone 10 mg tablet    . methimazole (TAPAZOLE) 10 MG tablet Take 10 mg by mouth 3 (three) times daily.    . Multiple Vitamins-Minerals (MULTIVITAMIN WITH MINERALS) tablet Take 1 tablet by mouth daily.    . tadalafil (CIALIS) 10 MG tablet TAKE 1 OR 2 TABLETS BY MOUTH AS NEEDED. max daily dose is 20 milligrams    . tamsulosin (FLOMAX) 0.4 MG CAPS capsule Take 0.4 mg by mouth daily.     No current facility-administered medications for this visit.    ROS:   General:  No weight loss, Fever, chills  HEENT: No recent headaches, no nasal bleeding, no visual changes, no sore throat  Neurologic: No dizziness, blackouts, seizures. No recent symptoms of stroke or mini- stroke. No recent episodes of slurred speech, or temporary blindness.  Cardiac: No recent episodes of chest pain/pressure, no shortness of breath at rest.  No shortness of breath with exertion.  Denies history of atrial fibrillation or irregular heartbeat  Vascular: No history  of rest pain in feet.  No history of claudication.  No history of non-healing ulcer, No history of DVT   Pulmonary: No home oxygen, no productive cough, no hemoptysis,  No asthma or wheezing  Musculoskeletal:  [X]  Arthritis, [X]  Low back pain,  [X]  Joint pain  Hematologic:No history of hypercoagulable state.  No history of easy bleeding.  No history of anemia  Gastrointestinal: No hematochezia or melena,  No gastroesophageal reflux, no trouble swallowing  Urinary: [ ]  chronic Kidney disease, [ ]  on HD - [ ]  MWF or [ ]  TTHS, [ ]  Burning with urination, [ ]  Frequent urination, [ ]  Difficulty urinating;   Skin: No rashes  Psychological: No history of anxiety,  No history of  depression   Physical Examination  Vitals:   07/21/20 1324  BP: (!) 147/82  Pulse: 84  Resp: 20  Temp: 98.2 F (36.8 C)  SpO2: 95%  Weight: 298 lb 4.8 oz (135.3 kg)  Height: 5\' 10"  (1.778 m)    Body mass index is 42.8 kg/m.  General:  Alert and oriented, no acute distress HEENT: Normal Neck: No JVD Cardiac: Regular Rate and Rhythm Skin: No rash, brawny hemosiderin staining circumferentially gaiter area bilaterally Extremity Pulses:  2+ radial, brachial, femoral, absent dorsalis pedis, 2+ posterior tibial pulses bilaterally Musculoskeletal: No deformity trace pretibial and ankle edema bilaterally  Neurologic: Upper and lower extremity motor 5/5 and symmetric  DATA:  Patient had a venous duplex for reflux today.  This shows diffuse reflux in the left and right greater saphenous veins with about a 5 to 10 mm vein diameter.  He also had deep vein reflux on the left side.  ASSESSMENT: Diffuse superficial venous reflux probably contributing to some but not all of the symptoms in his lower extremities.  Certainly the heaviness fullness aching and swelling could be from his venous insufficiency.  I discussed with the patient today the possibility of laser ablation of the greater saphenous vein for improvement of symptoms.  We also discussed using compression therapy which can also improve symptoms.  We also discussed weight loss which will improve 75% of vein problems.   PLAN: At this point the patient has opted for lower extremity compression stockings to see if these continue to improve his symptoms.  He is also going to try to lose some weight as he has put on 40 pounds in the last 2 years.  Patient was reassured that he is not at risk of limb loss with palpable pulses in both feet.  I discussed with him that vein problems are primarily nuisance type symptoms as he is experiencing.  He will follow up with on an as-needed basis.   , MD [facility] Office: [phone]

## 2020-11-19 IMAGING — MR MR LUMBAR SPINE W/O CM
4 of 5 series · 26 of 48 positions shown · non-contrast
Comparison: CT lumbar spine 01/06/2015

CLINICAL DATA: Increasing back pain with bilateral leg pain.

EXAM:
MRI LUMBAR SPINE WITHOUT CONTRAST
TECHNIQUE: Multiplanar, multisequence MR imaging of the lumbar spine was
performed. No intravenous contrast was administered.

[Series 3: T2 post-contrast · sagittal · 4.0mm · 0.55mm/px · 6 of 17 slices shown]
[im 1/17]
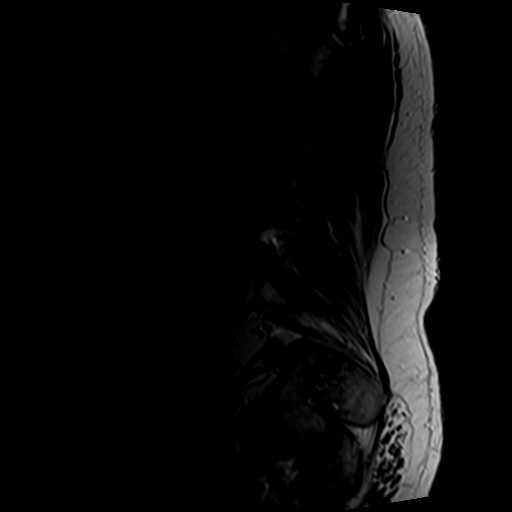
[im 4/17]
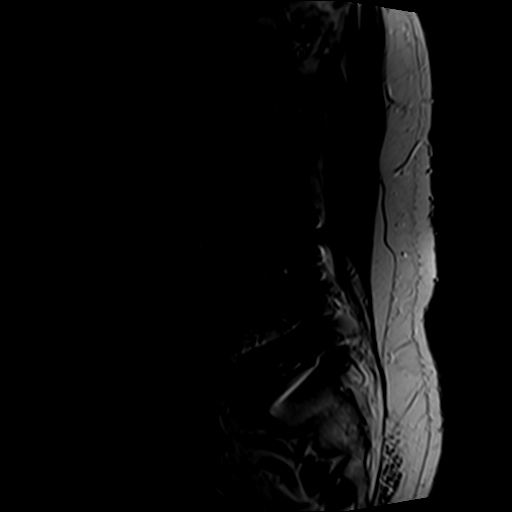
[im 7/17]
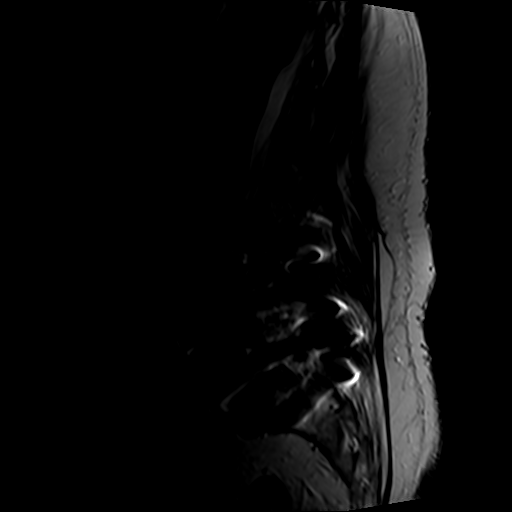
[im 10/17]
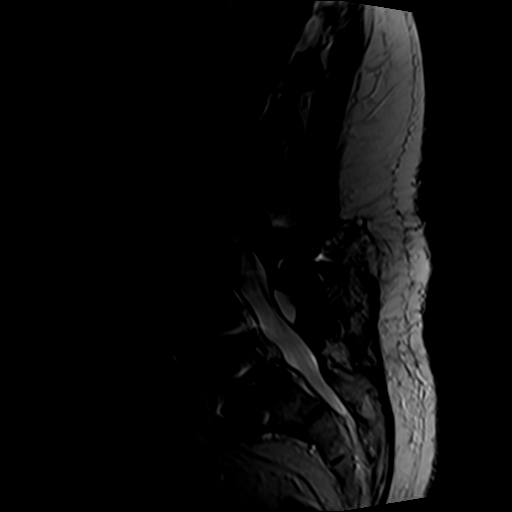
[im 13/17]
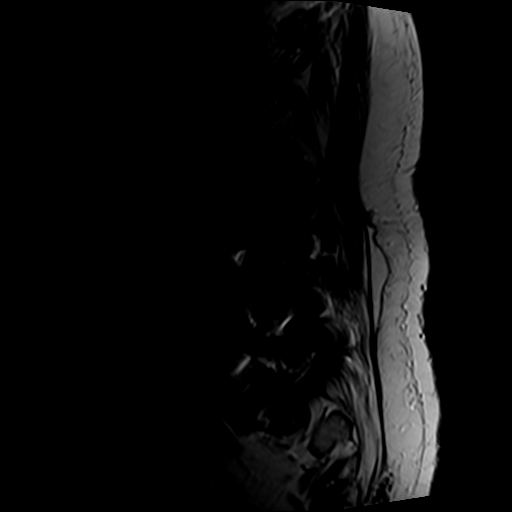
[im 17/17]
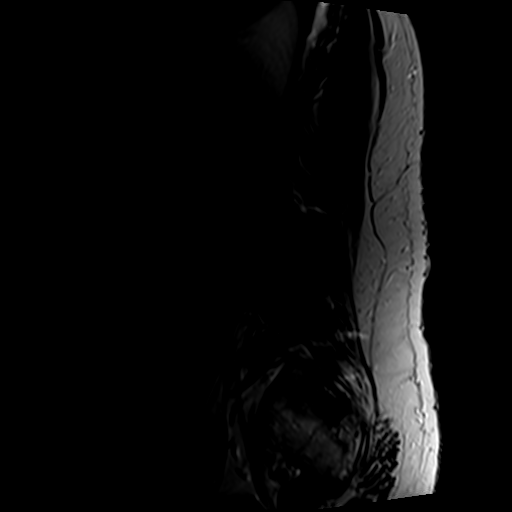

[Series 5: T1 · sagittal · 4.0mm · 0.55mm/px · 6 of 17 slices shown (1 of 2)]
[im 1/17]
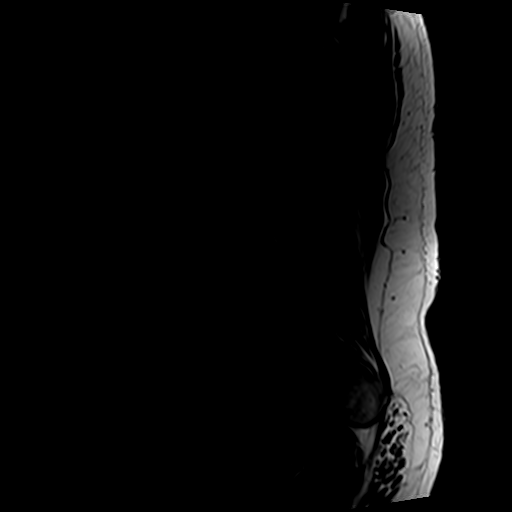
[im 4/17]
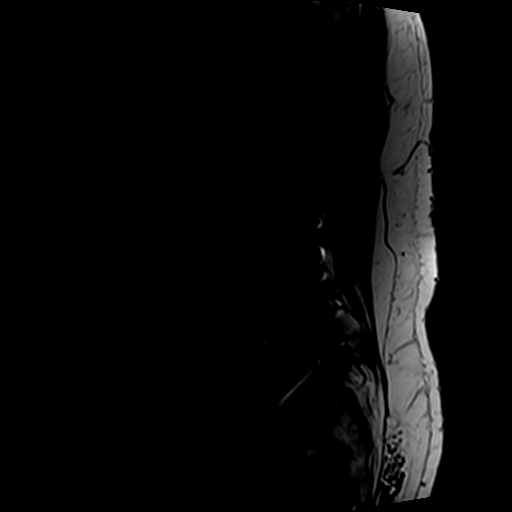
[im 7/17]
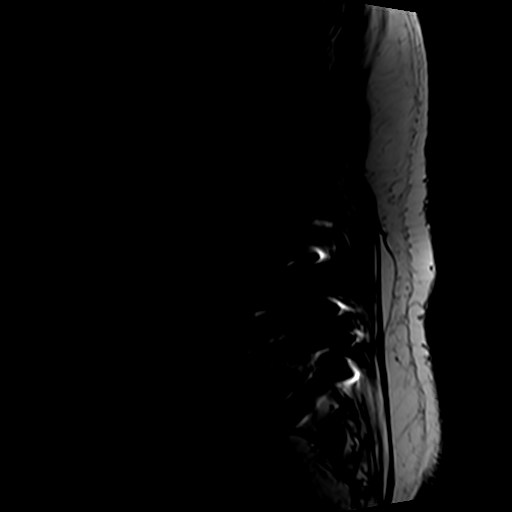
[im 10/17]
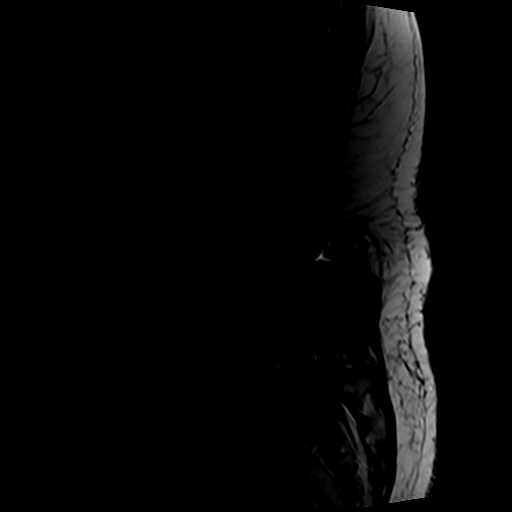
[im 13/17]
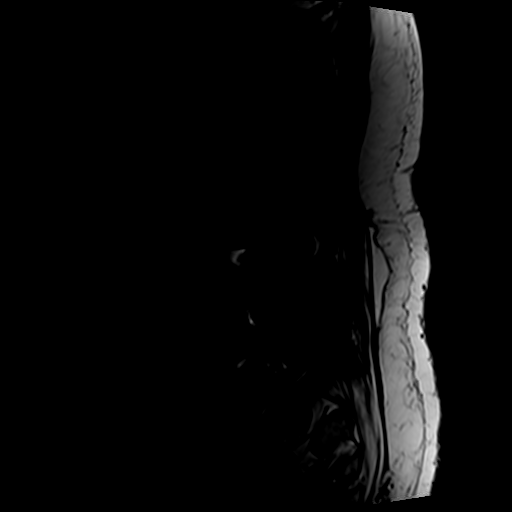
[im 17/17]
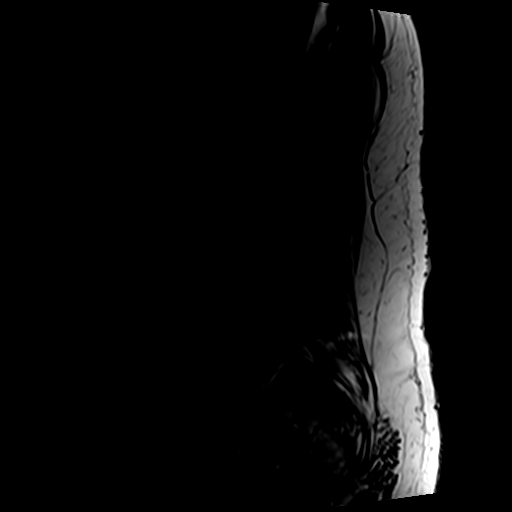

[Series 6: T1 · axial · 4.0mm · 0.35mm/px · z∈[-104,+86]mm · 5 of 41 slices shown (2 of 2)]
[im 1/41]
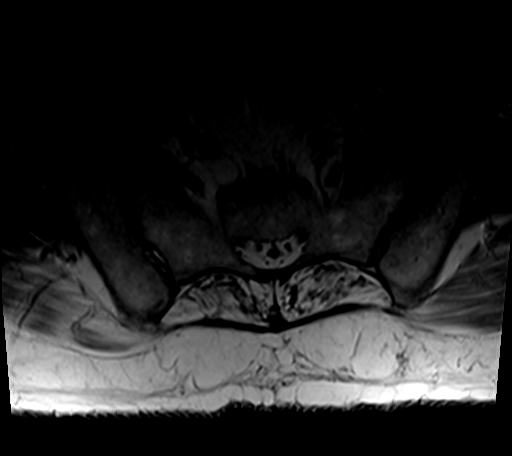
[im 6/41]
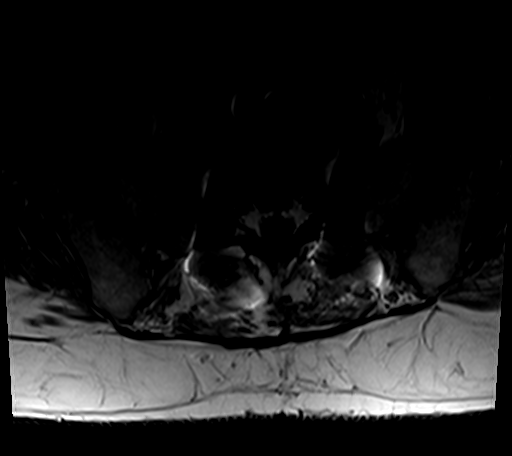
[im 12/41]
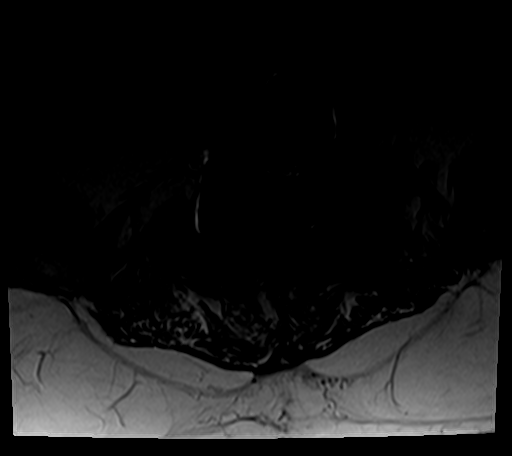
[im 21/41]
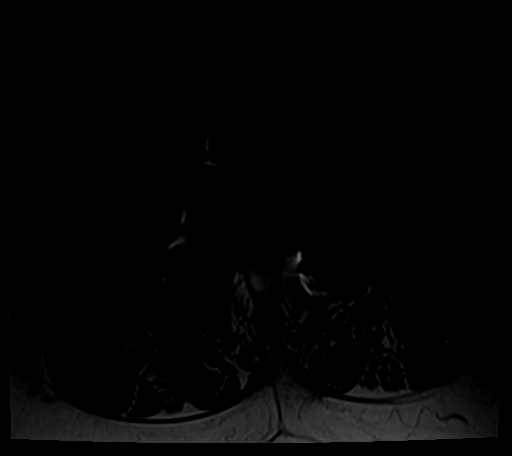
[im 35/41]
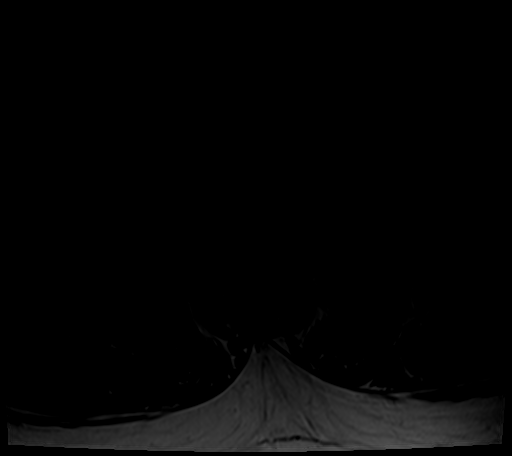

[Series 7: T2 · axial · 4.0mm · 0.70mm/px · z∈[-104,+117]mm · 9 of 41 slices shown]
[im 1/41]
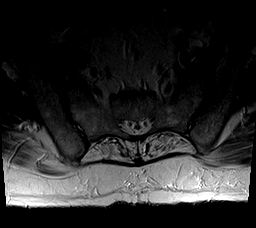
[im 6/41]
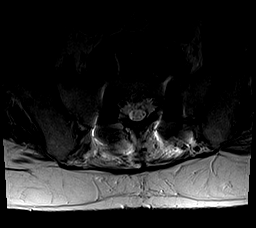
[im 12/41]
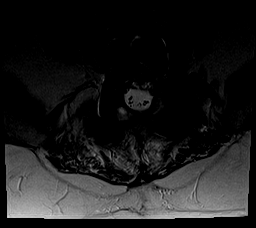
[im 18/41]
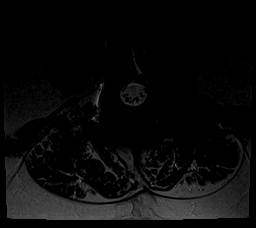
[im 21/41]
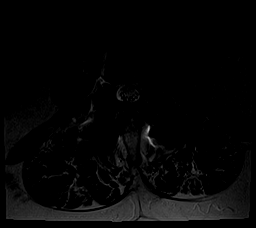
[im 23/41]
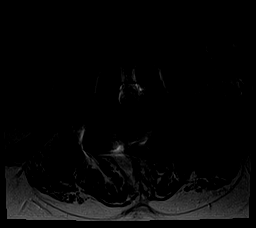
[im 29/41]
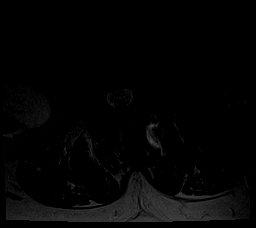
[im 35/41]
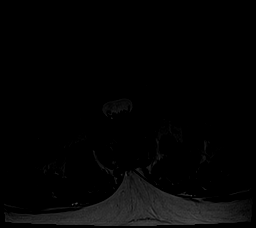
[im 41/41]
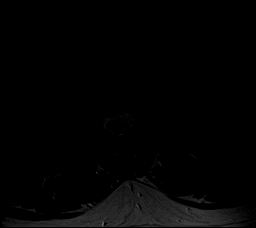

[26 of 48 positions shown; findings below may reference images not displayed]

FINDINGS: Segmentation:  Normal

Alignment:  Mild retrolisthesis L2-3.  Mild anterolisthesis L5-S1

Vertebrae: PLIF L3-4, L4-5, L5-S1. Hardware causing artifact.
Allowing for this, no fracture or mass lesion identified.

Conus medullaris and cauda equina: Conus extends to the T12-L1
level. Conus and cauda equina appear normal.

Paraspinal and other soft tissues: Right renal cyst. No paraspinous
mass.

Disc levels:

T12-L1: Negative

L1-2: Negative

L2-3: Mild retrolisthesis. Disc degeneration with endplate spurring
and bilateral facet hypertrophy causing severe spinal stenosis which
has progressed in the interval. Moderate subarticular stenosis
bilaterally

L3-4: PLIF with posterior decompression.  Negative for stenosis.

L4-5: PLIF with posterior decompression. Negative for stenosis.
Small fluid collections posterior to the dura likely chronic
postoperative fluid collections measuring under 1 cm each.

L5-S1: PLIF with mild anterolisthesis. Spinal canal adequate in
size. Mild foraminal narrowing bilaterally.
IMPRESSION: Severe spinal stenosis at L2-3 has progressed since 8185.

PLIF L3-4, L4-5, L5-S1 without significant spinal stenosis. Mild
foraminal narrowing bilaterally L5-S1.
